# Patient Record
Sex: Female | Born: 1965
Health system: Southern US, Community
[De-identification: ages and names within clinical notes are randomized; demographics above are authoritative.]

## PROBLEM LIST (undated history)

## (undated) DIAGNOSIS — K5909 Other constipation: Secondary | ICD-10-CM

## (undated) DIAGNOSIS — R0989 Other specified symptoms and signs involving the circulatory and respiratory systems: Secondary | ICD-10-CM

## (undated) DIAGNOSIS — F419 Anxiety disorder, unspecified: Secondary | ICD-10-CM

## (undated) DIAGNOSIS — F99 Mental disorder, not otherwise specified: Secondary | ICD-10-CM

## (undated) DIAGNOSIS — E28319 Asymptomatic premature menopause: Secondary | ICD-10-CM

## (undated) HISTORY — DX: Other constipation: K59.09

## (undated) HISTORY — DX: Asymptomatic premature menopause: E28.319

## (undated) HISTORY — DX: Other specified symptoms and signs involving the circulatory and respiratory systems: R09.89

## (undated) HISTORY — DX: Anxiety disorder, unspecified: F41.9

## (undated) HISTORY — DX: Mental disorder, not otherwise specified: F99

---

## 2005-12-07 ENCOUNTER — Ambulatory Visit (HOSPITAL_COMMUNITY): Admission: RE | Admit: 2005-12-07 | Discharge: 2005-12-07 | Payer: Self-pay | Admitting: Obstetrics and Gynecology

## 2006-07-09 ENCOUNTER — Ambulatory Visit (HOSPITAL_COMMUNITY): Admission: RE | Admit: 2006-07-09 | Discharge: 2006-07-09 | Payer: Self-pay | Admitting: Family Medicine

## 2007-05-31 ENCOUNTER — Emergency Department (HOSPITAL_COMMUNITY): Admission: EM | Admit: 2007-05-31 | Discharge: 2007-05-31 | Payer: Self-pay | Admitting: *Deleted

## 2007-06-23 DIAGNOSIS — R0989 Other specified symptoms and signs involving the circulatory and respiratory systems: Secondary | ICD-10-CM

## 2007-06-23 DIAGNOSIS — R09A2 Foreign body sensation, throat: Secondary | ICD-10-CM

## 2007-06-23 DIAGNOSIS — R198 Other specified symptoms and signs involving the digestive system and abdomen: Secondary | ICD-10-CM

## 2007-06-23 HISTORY — DX: Other specified symptoms and signs involving the digestive system and abdomen: R19.8

## 2007-06-23 HISTORY — DX: Foreign body sensation, throat: R09.A2

## 2007-06-23 HISTORY — DX: Other specified symptoms and signs involving the circulatory and respiratory systems: R09.89

## 2007-07-29 ENCOUNTER — Ambulatory Visit: Payer: Self-pay | Admitting: Gastroenterology

## 2007-10-20 ENCOUNTER — Ambulatory Visit: Payer: Self-pay | Admitting: Internal Medicine

## 2007-10-21 HISTORY — PX: ESOPHAGOGASTRODUODENOSCOPY: SHX1529

## 2007-10-25 ENCOUNTER — Encounter: Payer: Self-pay | Admitting: Gastroenterology

## 2007-10-25 ENCOUNTER — Ambulatory Visit: Payer: Self-pay | Admitting: Gastroenterology

## 2007-10-25 ENCOUNTER — Ambulatory Visit (HOSPITAL_COMMUNITY): Admission: RE | Admit: 2007-10-25 | Discharge: 2007-10-25 | Payer: Self-pay | Admitting: Gastroenterology

## 2008-12-10 ENCOUNTER — Ambulatory Visit (HOSPITAL_COMMUNITY): Admission: RE | Admit: 2008-12-10 | Discharge: 2008-12-10 | Payer: Self-pay | Admitting: General Surgery

## 2010-09-08 ENCOUNTER — Ambulatory Visit (INDEPENDENT_AMBULATORY_CARE_PROVIDER_SITE_OTHER): Payer: PRIVATE HEALTH INSURANCE | Admitting: Urgent Care

## 2010-09-08 ENCOUNTER — Encounter: Payer: Self-pay | Admitting: Urgent Care

## 2010-09-08 DIAGNOSIS — K5909 Other constipation: Secondary | ICD-10-CM

## 2010-09-16 ENCOUNTER — Encounter: Payer: Self-pay | Admitting: Gastroenterology

## 2010-09-18 NOTE — Assessment & Plan Note (Signed)
Summary: ABD PAIN WITH CONSTIPATION   Vital Signs:  Patient profile:   45 year old female Height:      63 inches Weight:      177 pounds BMI:     31.47 Temp:     98.5 degrees F oral Pulse rate:   72 / minute BP sitting:   122 / 88  (left arm)  Vitals Entered By: Carolan Clines LPN (September 08, 2010 2:28 PM)  Visit Type:  Follow-up Visit Primary Care Provider:  Dr. Malvin Johns  Chief Complaint:  constipation.  History of Present Illness: 45 y/o hispanic female here w/ c/o constipation.  Has had constipation w/ BM q3 days, tried metamucil, seems to help w/ BM two times a day.  Has an MD in [place], had colonoscopy & believes she has "swollen intestines".  Colonoscopy normal.  Biopsies were taken, ? results.  Pain resolved w/ defecation.  Was given Debridat AP Trimebutina seemed to help some.  Miralax no help.  c/o abd bloating x several mo.  Lives in [place], but was visiting [place].  Denies vomiting, heartburn, indigestion or nausea.  Wt stable.    LMP 3 yrs ago, was seen by GYN in [place], told "early menopause"  Pacific Interpreter # [phone]  Current Medications (verified): 1)  Metamucil 30.9 % Powd (Psyllium) .... Take Two Tbsp Bid  Allergies (verified): No Known Drug Allergies  Past History:  Past Medical History: chronic constipation globus early menopause  normal EGDs Dr Darrick Penna 5/09  Past Surgical History: Unremarkable  Family History: No known family history of colorectal carcinoma, IBD, liver or chronic GI problems.  Social History: married 7 yrs 2 healthy Equity Patient has never smoked.  Alcohol Use - no Illicit Drug Use - no Smoking Status:  never Drug Use:  no  Review of Systems General:  Denies fever, chills, sweats, anorexia, fatigue, weakness, malaise, weight loss, and sleep disorder. ENT:  Denies earache, ear discharge, tinnitus, decreased hearing, nasal congestion, loss of smell, nosebleeds, sore throat, hoarseness, and difficulty swallowing; feels  like something in tonsils, seen by Dr Pollyann Kennedy. CV:  Denies chest pains, angina, palpitations, syncope, dyspnea on exertion, orthopnea, PND, peripheral edema, and claudication. Resp:  Denies dyspnea at rest, dyspnea with exercise, cough, sputum, wheezing, coughing up blood, and pleurisy. GI:  Denies difficulty swallowing, pain on swallowing, jaundice, and fecal incontinence. GU:  Denies urinary burning, blood in urine, nocturnal urination, urinary frequency, urinary incontinence, abnormal vaginal bleeding, and amenorrhea; early menopause. MS:  Denies joint pain / LOM, joint swelling, joint stiffness, joint deformity, low back pain, muscle weakness, muscle cramps, muscle atrophy, leg pain at night, leg pain with exertion, and shoulder pain / LOM hand / wrist pain (CTS). Derm:  Denies rash, itching, dry skin, hives, moles, warts, and unhealing ulcers. Psych:  Denies depression, anxiety, memory loss, suicidal ideation, hallucinations, paranoia, phobia, and confusion. Heme:  Denies bruising, bleeding, and enlarged lymph nodes.  Physical Exam  General:  Well developed, well nourished, no acute distress. Head:  Normocephalic and atraumatic. Eyes:  Sclera clear, no icterus. Ears:  Normal auditory acuity. Nose:  No deformity, discharge,  or lesions. Mouth:  No deformity or lesions, dentition normal. Neck:  Supple; no masses or thyromegaly. Lungs:  Clear throughout to auscultation. Heart:  Regular rate and rhythm; no murmurs, rubs,  or bruits. Abdomen:  Soft, mild tenderness LLQ on palpation and nondistended. No masses, hepatosplenomegaly or hernias noted. Normal bowel sounds.without guarding and without rebound.   Msk:  Symmetrical with  no gross deformities. Normal posture. Pulses:  Normal pulses noted. Extremities:  No clubbing, cyanosis, edema or deformities noted. Neurologic:  Alert and  oriented x4;  grossly normal neurologically. Skin:  Intact without significant lesions or rashes. Cervical  Nodes:  No significant cervical adenopathy. Psych:  Alert and cooperative. Normal mood and affect.   Impression & Recommendations:  Problem # 1:  CONSTIPATION, CHRONIC (ICD-564.09) 45 y/o  Hispanic female with chronic constipation. she is failed MiraLax and fiber. Will give a trial of him and he's 24 micrograms 1-2 daily when necessary. (2 boxes of samples given). she had a normal colonoscopy in on them all however I do not have biopsy reports. Orders: Est. Patient Level III (04540)  Patient Instructions: 1)   constipation literature given in Spanish 2)   office visit in 6-8 weeks with Dr. Darrick Penna 3)   She is warned about taking this medicine during pregnancy. However she tells me that since she has already gone through early menopause this is not an issue. Prescriptions: AMITIZA 24 MCG CAPS (LUBIPROSTONE) 1-2 by mouth daily as needed constipation w/ food  #62 x 2   Entered and Authorized by:   Joselyn Arrow FNP-BC   Signed by:   Joselyn Arrow FNP-BC on 09/08/2010   Method used:   Electronically to        [facility] (retail)       [address]       [place], [place]  [address]       Ph: [phone]       Fax: [phone]   RxID:   [id number]    Orders Added: 1)  Est. Patient Level III [72536]  Appended Document: ABD PAIN WITH CONSTIPATION F/U OPV W/ DR FIELDS IS IN THE COMPUTER

## 2010-10-16 ENCOUNTER — Encounter: Payer: Self-pay | Admitting: Gastroenterology

## 2010-10-16 ENCOUNTER — Ambulatory Visit (INDEPENDENT_AMBULATORY_CARE_PROVIDER_SITE_OTHER): Payer: PRIVATE HEALTH INSURANCE | Admitting: Gastroenterology

## 2010-10-16 VITALS — BP 130/76 | HR 76 | Temp 97.7°F | Ht 65.0 in | Wt 173.4 lb

## 2010-10-16 DIAGNOSIS — R109 Unspecified abdominal pain: Secondary | ICD-10-CM

## 2010-10-16 DIAGNOSIS — K5909 Other constipation: Secondary | ICD-10-CM

## 2010-10-16 MED ORDER — LUBIPROSTONE 8 MCG PO CAPS: ORAL_CAPSULE | ORAL | Status: DC

## 2010-10-16 NOTE — Patient Instructions (Signed)
Stop Metamucil. Add Amitiza ONE PILL AT NIGHT FOR 7 DAYS THEN ONCE DAILY. FOLLOW UP IN ONE MONTH. STOP DAIRY PRODUCTS. SEE HO. READ INFORMATION ABOUT Sndrome de colon irritable.  Dieta libre de Advice worker (Diet - Lactose-Free) La lactosa es un carbohidrato que se encuentra principalmente en la [place] y los productos lcteos, como tambin en alimentos con [place] y suero agregados. Para que la lactosa pueda ser [place] por el cuerpo, debe ser digerida por una enzima. La intolerancia a la lactosa ocurre cuando hay una escasez de [place]. Cuando su cuerpo no puede digerir la lactosa, puede sentir nuseas, hinchazn, calambres, gases y [place]. TIPOS DE DEFICIENCIA DE LACTASA  Deficiencia de lactasa primaria. ste es el tipo ms comn. Se caracteriza por una reduccin lenta de la actividad de la lactasa.   Deficiencia de Altamease Oiler. Esto ocurre luego de una lesin en la mucosa del instestino delgado como resultado de enfermedades como la enfermedad celaca, esprue no tropical, gastroenteritis infecciosa (virus estomacal), desnutricin, parsitos, o enfermedad inflamatoria intestinal. Tambin puede ocurrir luego de Education officer, environmental un tratamiento con medicamentos que matan grmenes (antibiticos), o drogas para Management consultant, o como resultado de [place].  La tolerancia a la lactosa vara ampliamente, y cada persona debe determinar cunta cantidad de [place] puede consumir para no desarrollar sntomas. Beber pequeas porciones de Merck & Co puede ser de [place]. Algunos estudios sugieren que retardar el vaciamiento gstrico puede ayudar a aumentar la tolerancia a productos lcteos. Esto puede realizarse mediante:  El consumo de [place] o productos lcteos acompaado de otros alimentos, Teacher, English as a foreign language de consumirlos solos.   Consumir leche con un mayor contenido graso.  Existen muchos productos lcteos que International aid/development worker pueden tolerar mejor que la leche:  El queso (especialmente queso aejo) - el  contenido de lactosa es mucho menor que en la [place].   El consumo de productos lcteos cultivados, como yogur, suero de [place], requesn, y [place] de [address] (kfir) normalmente es bien tolerado por individuos con deficiencia de Building control surveyor. Esto ocurre porque las bacterias ayudan a Therapist, nutritional.   La leche con lactosa hidrolizada contiene un 40-90% menos de lactosa que la [place] y tambin puede ser [place].  REQUERIMIENTOS Estas dietas pueden ser deficientes en calcio, riboflavina, y vitamina D, segn los Recommended Dietary Allowances (cantidades recomendadas en la dieta) del Exxon Mobil Corporation ([employer] de [place]). Es posible que se puedan PepsiCo recomendados, esto depende de la [place] individual y el consumo de sustitutos de [place], [place], u otros productos lcteos. NOTAS ESPECIALES  La lactosa es un carbohidrato. La principal fuente de alimento son los productos lcteos. Es Secondary school teacher los rtulos de los alimentos. Muchos productos contienen lactosa aunque no hayan sido hechos a partir de Freescale Semiconductor. Busque las siguientes palabras: Suero, slidos lcteos, slidos lcteos deshidratados, polvo de [place] sin grasa. Entre las fuentes comunes de lactosa adems de los productos lcteos se incluyen panes, caramelos, embutidos, alimentos preparados y procesados, y salsas y Financial controller.   Todos los alimentos deben prepararse sin [place], crema u otros productos lcteos.   Puede ser necesario un suplemento de vitaminas/minerales. Consulte con su mdico o nutricionista registrado.   La lactosa tambin se encuentra en muchos medicamentos de prescripcin o de venta libre.   Puede consumir leche de soja y suplementos libres de lactosa como alternativa a la [place].  GRUPO DE ALIMENTOS PERMITIDOS/RECOMENDADOS EVITE / USE MODERADAMENTE  PANES/FECULAS 4 porciones o ms*  Panes y bollos hechos sin leche. Pan francs,  de [place], [place]. Panes y bollos  hechos que ConocoPhillips. Mezclas preparadas como pasteles, bizcochos, buuelos, panqueques. [place]quillas dulces, donas, tostada francesa (si contiene [place]).  Galletas:  Galletas de soda, galletas graham. Cualquier galleta preparada sin lactosa. Bizcochos tostados, o cualquiera que Centex Corporation.  Cereales: Cereales cocidos o deshidratados preparados sin lactosa (vea el rtulo).  Cereales cocidos o deshidratados preparados con lactosa (vea el rtulo). Total, Cocoa Krispies, Special K.  Patatas / pastas / arroz: Cualquiera, preparados sin [place] o [place]. Palomitas de maz. Pur de papas instantneo, papas fritas congeladas, papas festoneadas o gratinadas.  VEGETALES 2 porciones o ms Vegetales frescos, congelados o enlatados.  Vegetales con crema o rebozados. Vegetales en salsa de queso o con margarinas que contengan lactosa.  FRUTAS 2 porciones o ms Frutas frescas, enlatadas o congeladas que no estn procesadas con lactosa. Frutas enlatadas o congeladas que hayan sido procesadas con lactosa.  CARNES Y SUSTITUTOS 2 porciones o ms 100 A 150 g por da Bife, pollo, pescado, pavo, cordero, ternero, cerdo o [place]. Productos preparados con carne. Alimentos crnicos preparados para bebs que no contengan leche. Huevos, soja, frutos secos. Huevos revueltos, omelettes y souffles que ConocoPhillips. Scrambled eggs, omelets, and souffles that contain milk. Carne, pescado o aves de corral con crema o empanadas. Salchichas de viena, leverwurst o fiambres que contengan slidos de [place]. Queso, queso cottage o queso untable.  LECHE The Mutual of Omaha. (Vase "BEBIDAS" para los sustitutos de Freescale Semiconductor . Vase "POSTRES" para helados y postres helados.) Leche (entera, al 2%, descremada o chocolatada). Evaporada, en polvo o condensada; Monsanto Company.  SOPAS Y ALIMENTOS COMBINADOS  Sopa, caldo, sopa de verduras, consoms. Sopas preparadas en casa con los alimentos permitidos. Alimentos combinados o preparados  que no contengan leche ni productos lcteos (lea las etiquetas). Sopas crema, en latas. Sopas comerciales que contengan lactosa Macaroni con queso, pizza. Alimentos combinados o preparados que contengan leche o productos lcteos.  POSTRES Y DULCES  (con moderacin) Helados de agua y de fruta; gelatina; torta ngel. Galletitas, tortas, pasteles caseros preparados con los ingredientes permitidos. Budn (preparado con agua o sustituto de [place]). Postres de tofu sin lactosa. Azcar, miel, jarabe de maz, mermelada, gelatina, dulces, melaza (azcar de caa); caramelos de azcar; marshmallows. Helados de crema, sorbetes, flan, budn, yougur helado. Mezclas comerciales para preparar tortas y galletitas. Postres que contengan chocolate. Masa para pastel que Clinical research associate; postres reducidos en caloras preparados con sustitutos del azcar que Teacher, adult education. Caramelos toffee, de menta, caramelos duros, chocolate.  GRASAS Y ACEITES (con moderacin) [place], (segn la tolerancia; contiene muy pequea cantidad de lactosa). Margarinas y Amgen Inc no contengan [place], aceites [place], [place], Miracle Kildeer, [place], crema artificial y coberturas sin Advice worker ni slidos de [place] agregados (ejemplos: Coffee Rich, Carnation Bucks, Rich's Whipped Topping, Social worker). [place]. Margarinas y aderezos para [address] que contengan [place]; [place], [place] de man con slidos de [place] agregados, crema agria, bocaditos preparados con crema agria.  BEBIDAS Bebidas carbonatadas, t, caf, y caf soluble Carbonated drinks; tea; caf y caf soluble; algunos cafs instantneos (verifique las etiquetas) Bebidas frutales; jugos de frutas y de vegetales; [place] de arroz o de soja.  Ovaltine, chocolate caliente. Algunos cacaos, algunos cafs instantneos; ts instantneos; jugos en polvo (lea las etiquetas).  CONDIMENToS / MISCELNEA  Salsa de soja, polvo de algarroba, aceitunas, salsa preparada con agua, cacao,  condimentos y especias, glutamato monosdico, catsup, Clinical research associate. Algunas gomas de mascar, chocolate, algunos cacaos. Ciertos antibiticos y preparados de vitaminas y minerales. Condimentos  que contengan productos lcteos. Endulzantes artificieles que contengan lactosa, como Equal (Nutra-Sweet) y Sweet 'n Low. Algunas cremas no lcteas (lea las etiquetas).  *Estas cantidades indican el nmero mnimo de porciones que se necesitan de los grupos bsicos de alimentos para proporcionar una variedad de nutrientes que son fundamentales para Pharmacologist una buena salud. La cantidad mxima se indica cuando debe controlarse la cantidad de ciertos alimentos. La combinacin de alimentos puede contarse como porciones completas o parciales de los grupos de alimentos. Se recomienda consumir vegetales de hoja de color verde oscuro o vegetales de color anaranjado 3  4 veces por semana, para incorporar vitamina A. Se recomienda el consumo diario de alimentos que sean fuente de vitamina C. Las patatas pueden incluirse como porcin de vegetales. EJEMPLO DE MEN*  Desayuno   Jugo de naranjas   [place]tano   Bran flakes   Desnatador no lcteo  Pan de [place]ien[place] (brind)   [place] o Psychiatrist y Mining engineer que no Therapist, sports   T o caf    Almuerzo   Doctor, hospital de pollo  Arroz   Habichuelas   [place] o Psychiatrist y aderezos que no contengan leche   Meln fresco   T o caf    Cena   Ase carne de vaca  Papa asada   [place] o margarinas y aderezos que no contengan leche   Brcol  Ensalada de Company secretary con el vinagre y el petrleo que visten   Bizcocho de alimento de [place]   T [place]   Document Released: 06/08/2005 Document Re-Released: 12/05/2007 ExitCare Patient Information 2011 [place], [place]. ONLY DECAFFEINATED COFFEE.  Sndrome de colon irritable (Colon espstico) (Irritable Bowel Syndrome, Spastic Colon) El sndrome de colon irritable se origina en un trastorno de la funcin normal del intestino  Tambin se lo denomina colon espstico, colitis mucosa y colon irritable. No es necesario realizar un tratamiento quirrgico, ni es un problema que pueda transformarse en cncer. No hay cura para este sndrome. Pero con Neomia Dear dieta apropiada, reduccin del estrs y Tourist information centre manager, usted notar que sus problemas (sntomas) gradualmente desaparecern o mejorarn. Se trata de una enfermedad frecuente del aparato digestivo. Aparece con frecuencia a finales de la adolescencia o comienzos de la vida adulta., La proporcin de mujeres que sufren este trastorno es del doble que los hombres. CAUSAS Luego que el alimento es digerido y absorbido en el intestino delgado, Animator de desecho se dirige hacia el colon (intestino grueso). En el colon se absorben el agua y las sales que provienen de los alimentos no digeridos que se encuentran en el intestino delgado. Los residuos que [place], o materia fecal, se retiene para su posterior eliminacin. Bajo circunstancias normales, ciertas contracciones suaves y rtmicas de las paredes del intestino empujan la materia fecal a lo largo del colon, hacia el recto. Sin embargo, cuando se presenta este problema, estas contracciones son irregulares y no coordinadas. Entonces ocurre que la materia fecal se retiene por un perodo prolongado, lo que origina constipacin, o se expele demasiado rpido, lo que produce diarrea. SNTOMAS El sntoma ms frecuente es Chief Technology Officer. Lo ms frecuente es que el dolor se localice en la zona izquierda inferior del vientre (abdomen). Pero puede ocurrir en cualquier rea del abdomen. Puede sentirse como acidez de [place], Engineer, mining de espalda o Teacher, adult education un dolor sordo en los brazos o en los hombros. El dolor se origina en espasmos excesivos de los msculos del intestino y de la acumulacin de gases y materia fecal en el colon. Este dolor:  Puede oscilar entre clicos agudos en el vientre (abdomen) hasta un dolor sordo y continuo.   Generalmente empeora luego de  comer.   Es caracterstico que se alivie al mover el intestino o Halliburton Company gases.  Generalmente se acompaa de constipacin. Pero tambin puede PACCAR Inc. La diarrea se produce inmediatamente despus de comer o al levantarse por la maana. Las heces son blandas y International aid/development worker. Con frecuencia estn mezcladas con secreciones (mucus). Otros sntomas son:  Environmental manager   ONEOK   Prdida del apetito  Ganas de vomitar (nuseas)   Vmitos  Esta enfermedad tambin puede causar otros sntomas que no se relacionan con el sistema digestivo.  Fatiga.   Estados de ansiedad   Dificultades de Librarian, academic.  Dolor de [place].   Falta de [employer] Depot.   Estos sntomas tienden a Research officer, trade union y Geneticist, molecular. DIAGNSTICO Los sntomas se asemejan a los de otras enfermedades ms graves del aparato digestivo. Por lo tanto el profesional que lo asiste le indicar una serie adicional de anlisis para descartarlas. Debe estar seguro de hallar la causa (diagnostico) En este caso, se le explicar la naturaleza y el propsito de cada prueba. TRATAMIENTO Existe un buen nmero de medicamentos disponibles que ayudan a corregir el funcionamiento intestinal y/o a Occupational hygienist intestinales y Chief Technology Officer abdominal. Los medicamentos disponibles son:  Jodi Marble, no irritantes para los casos de constipacin grave y para Contractor a Dietitian.   Medicamentos antidiarreicos especficos para tratar los [place] graves o prolongados de [place].   Antiespasmdicos para Copywriter, advertising.   El profesional que lo asiste tambin podr indicarle un tranquilizante o sedante suave durante los perodos extraordinarios de estrs en su vida.  Lo ms importante es recordar que si le prescriben un medicamento, deber tomarlo exactamente como se lo han indicado. Hgale saber al profesional que lo asiste si ha obtenido alivio al tomarlo. INSTRUCCIONES PARA  EL CUIDADO DOMICILIARIO  Evite los alimentos ricos en grasas o aceites. Por ejemplo, crema, manteca, salchichas, embutidos y otras comidas grasas.   Evite los alimentos que puedan tener efectos laxantes, como frutas, jugos de fruta y productos lcteos.   Elimine las bebidas gaseosas, la goma de [place] y los alimentos que producen gases como frijoles y col. Esto ayuda a Technical sales engineer hinchazn y los eructos.   Consuma salvado con gran cantidad de lquidos puede ayudar a combatir la constipacin.   Observe cules son los alimentos que originan sus sntomas.   Evite las situaciones de gran carga emocional o las circunstancias que producen ansiedad.   Comience o contine con un plan de ejercicios.   Descanse y duerma lo suficiente.  EST SEGURO QUE:   Comprende las instrucciones para el alta mdica.   Controlar su enfermedad.   Solicitar atencin mdica de inmediato segn las indicaciones.  Document Released: 06/08/2005 Document Re-Released: 10/25/2008 [facility] Patient Information 2011 [place], [place].

## 2010-10-20 ENCOUNTER — Encounter: Payer: PRIVATE HEALTH INSURANCE | Admitting: Gastroenterology

## 2010-10-23 ENCOUNTER — Encounter: Payer: Self-pay | Admitting: Gastroenterology

## 2010-10-23 DIAGNOSIS — R109 Unspecified abdominal pain: Secondary | ICD-10-CM | POA: Insufficient documentation

## 2010-10-23 NOTE — Progress Notes (Signed)
Pt is aware of her one month follow up on 5/30 at 330 pm with [facility]

## 2010-10-23 NOTE — Progress Notes (Signed)
No current PCP.

## 2010-10-23 NOTE — Progress Notes (Signed)
  Subjective:    Patient ID: Zoe Garcia, female    DOB: Jun 03, 1966, 45 y.o.   MRN: [id number]  HPI PT IS SPANISH SPEAKING ONLY. History obtained via husband and interpreter [phone] ([phone]). Pt continues to complain of abd pain and bloating after she eats. The discomfort starts at the top and moves to the bottom. Last EGD 2009 for globus sensation-no Bx of stomach taken. Her husband is concerned because she thinks her abd problems are 2o to ? Oral sex and and they have not had sex in 2 years. She reportedly had a normal TCS in [place]. Pt upset because she was unable to get her Amitiza.  Past Medical History  Diagnosis Date  . Chronic constipation   . Early menopause   . Globus sensation 2009    NL BPE 2008, 2010   Past Surgical History  Procedure Date  . Esophagogastroduodenoscopy 05/09    NL ESO BX    Review of Systems  All other systems reviewed and are negative.   2009 176 lbs    Objective:   Physical Exam  Constitutional: She is oriented to person, place, and time. She appears well-developed and well-nourished. No distress.  HENT:  Head: Normocephalic and atraumatic.  Eyes: Pupils are equal, round, and reactive to light.  Neck: Normal range of motion. Neck supple.  Cardiovascular: Normal rate and regular rhythm.   Pulmonary/Chest: Effort normal.  Abdominal: Soft. Bowel sounds are normal. There is tenderness. There is no rebound and no guarding.       MILD LUQ  Musculoskeletal: She exhibits no edema.  Lymphadenopathy:    She has no cervical adenopathy.  Neurological: She is alert and oriented to person, place, and time.  Skin: Skin is warm and dry.          Assessment & Plan:

## 2010-10-23 NOTE — Assessment & Plan Note (Addendum)
Most likely 2o to IBS-c. The differential diagnosis includes H. pylori gastritis and less likely gastric CA.Weight stable since 2009.  EGD/GASTRIC Bx MON MAY 7. CT SCAN OF THE A/P IF NO SOURCE FOR ABD pain IDENTIFIED.

## 2010-10-23 NOTE — Assessment & Plan Note (Addendum)
Most likely 2o to IBS-c.  Stop Metamucil. Do not drink coffee that has caffeine in it. Decaf ok. Add Amitiza ONE PILL AT NIGHT FOR 7 DAYS THEN ONCE DAILY. FOLLOW UP IN ONE MONTH. STOP DAIRY PRODUCTS. SEE HO. READ INFORMATION ABOUT Sndrome de colon irritable.  TIME SPENT: 45 MINS to obtain H&p, discuss plan.

## 2010-10-27 ENCOUNTER — Ambulatory Visit (HOSPITAL_COMMUNITY)
Admission: RE | Admit: 2010-10-27 | Payer: PRIVATE HEALTH INSURANCE | Source: Ambulatory Visit | Admitting: Gastroenterology

## 2010-10-27 ENCOUNTER — Encounter: Payer: PRIVATE HEALTH INSURANCE | Admitting: Gastroenterology

## 2010-11-04 NOTE — Consult Note (Signed)
NAMEGUILA, OWENSBY            ACCOUNT NO.:  [id number]   MEDICAL RECORD NO.:  [id number]          PATIENT TYPE:  EMS   LOCATION:  ED                            FACILITY:  [facility]   PHYSICIAN:  Kassie Mends, M.D.      DATE OF BIRTH:  1965/07/14   DATE OF CONSULTATION:  07/29/2007  DATE OF DISCHARGE:  05/31/2007                                 CONSULTATION   REFERRING PHYSICIAN:  Barbaraann Barthel, M.D.   REASON FOR CONSULTATION:  Pain with swallowing.   HISTORY OF PRESENT ILLNESS:  Ms. Zoe Garcia is a 45 year old female who  has had complaints of pain with swallowing for the last 2 weeks.  She  vomited and saw something like a little ball come out.  She also  complains of bad breath.  Her throat only hurts when she swallows.  She  was tried on penicillin, which did not make any difference in her  symptoms.  She did not have any vomiting, indigestion, diarrhea or  constipation or weight loss.  She has not had a menstrual cycle in a  year.  She sometimes has to strain with bowel movements, and MiraLax  once a day did not help.  The information is obtained from her friend,  who has served as an Equities trader for the day.   PAST MEDICAL HISTORY:  None.   PAST SURGICAL HISTORY:  None.   ALLERGIES:  None.   MEDICATIONS:  Nexium 40 mg daily.   FAMILY HISTORY:  She denies any family history of colon cancer or colon  polyps.   SOCIAL HISTORY:  She is married and works at [employer]  She denies  any tobacco or alcohol use.   REVIEW OF SYSTEMS:  As per the HPI, otherwise all systems negative.   PHYSICAL EXAM:  VITAL SIGNS:  Weight 176 pounds, temperature 98.2, blood  pressure 120/72, pulse 60.  GENERAL:  She is in no apparent distress, alert and orient x4.  HEENT:  Exam is atraumatic, normocephalic.  Pupils equal and reactive to light.  Posterior pharynx without erythema, edema, deformities, or exudate.  Her  tonsils are small.  NECK:  Has full range of motion and no  lymphadenopathy.  Palpation of  the neck is nontender.  She was unable to appreciate any thyromegaly.  She has no lymphadenopathy.  LUNGS:  Clear to auscultation bilaterally.  CARDIOVASCULAR:  A regular rhythm, no murmur.  ABDOMEN:  Bowel sounds  are present, soft, nontender, nondistended.  No rebound or guarding.  EXTREMITIES:  Have no cyanosis or edema.  NEURO:  She has no focal  neurologic deficits.   RADIOGRAPHIC STUDIES:  January 2008, she had a barium swallow which  showed normal esophageal distention and motility.  She had no stricture  or mass and had an unobstructed passage of barium from the oral cavity  into her stomach.  Her views of the hypopharynx and cervical esophagus  were normal.   ASSESSMENT:  Ms. Hainer is a 44 year old female who has constipation  and odynophagia.  The odynophagia may be secondary to herpes simplex  esophagitis, pill esophagitis, or candida esophagitis.  She has a low  likelihood of having gastroesophageal reflux disease as an etiology for  odynophagia.   Thank you for allowing me to see Ms. Vine in consultation.  My  recommendations follow.   RECOMMENDATIONS:  1. She has increasing MiraLax to twice a day.  She is given a      prescription and a coupon.  2. She will have an upper endoscopy next week to evaluate odynophagia.  3. She has a follow-up appointment to see me in 2 months.      Kassie Mends, M.D.  Electronically Signed     SM/MEDQ  D:  07/29/2007  T:  07/30/2007  Job:  [id number]   cc:   Barbaraann Barthel, M.D.  Fax: [phone]

## 2010-11-04 NOTE — Op Note (Signed)
NAMEKENZLEIGH, Zoe Garcia              ACCOUNT NO.:  [id number]   MEDICAL RECORD NO.:  [id number]          PATIENT TYPE:  AMB   LOCATION:  DAY                           FACILITY:  [facility]   PHYSICIAN:  Kassie Mends, M.D.      DATE OF BIRTH:  [birth year]   DATE OF PROCEDURE:  10/25/2007  DATE OF DISCHARGE:                               OPERATIVE REPORT   REFERRING PHYSICIAN:  Barbaraann Barthel, MD.   PROCEDURE:  Esophagogastroduodenoscopy with cold forceps biopsy.   INDICATIONS FOR EXAM:  Ms. Oldenburg is a 45 year old female who  presents with a feeling like something stuck in her throat.  She was  seen and evaluated in February and scheduled for an endoscopy, which she  cancelled.  Additionally, she complained of pain with swallowing.  She  has also complained of bad breath.  She was placed on Nexium for 2  months and her symptoms did not improve.  She said she was seen and  evaluated by dentists, but they could not find a cause for her  halitosis.  She reports feeling like there is something in her throat,  and at times, she is able to cough little balls of white stuff out.  She  denies any difficulty swallowing food.  The history is somewhat limited  because the patient is Spanish-speaking only. History obtained via a  [place] interpreter.   FINDINGS:  1. Normal esophagus without evidence of Barrett's,  mass, erosion,      ulceration, or stricture.  Biopsies obtained 32 cm from the teeth      and 22 cm from the teeth to evaluate for eosinophilic esophagitis.  2. Normal stomach, duodenal bulb, and second portion of the duodenum.   DIAGNOSIS:  No source for Ms. Hunzeker's symptoms identified on upper  endoscopy.  No obvious mass identified in the posterior pharynx.   RECOMMENDATIONS:  1. For her constipation, she is asked to drink 6 to 8 cups of water      daily.  She may continue the MiraLax twice daily.  She may add      Dulcolax 10 mg daily if she is unable to achieve a bowel  movement      every 2 to 3 days.  2. She should follow a high-fiber diet.  She is given a handout on      high-fiber diet.  3. No aspirin and NSAIDs for 7 days.  No anticoagulation for 7 days.  4. We will schedule her an appointment with [facility] Thro[facility] to      have a complete evaluation of the posterior pharynx.  It sounds      like she is having food impacted on her tonsils, which is causing      her to feel like she has bad breath and pain with swallowing as      well as food stuck in at the back of her throat.  5. She has a follow up appointment to see me in 4 weeks to reevaluate      her constipation.  She needs an E30 visit.  MEDICATIONS:  1. Demerol 75 mg IV.  2. Versed 6 mg IV.   PROCEDURE TECHNIQUE:  Physical exam was performed.  Informed consent was  obtained from the patient after we explained the benefits, risks, and  alternatives to the procedure.  The patient was connected to monitor and  placed in left lateral position.  Continuous oxygen was provided by  nasal cannula.  IV medicine administered through an indwelling cannula.  After administration of sedation, the patient's esophagus was intubated  and the scope was advanced under direct visualization to the second  portion of duodenum.  The scope was removed slowly by carefully  examining the color, texture, anatomy, and integrity of the mucosa on  the way out.  The patient was recovered in endoscopy and discharged home  in satisfactory condition.   PATH:  Biopsies Nl-no evidence for eosinophilic or reflux esophagitis.      Kassie Mends, M.D.  Electronically Signed     SM/MEDQ  D:  10/25/2007  T:  10/26/2007  Job:  [id number]   cc:   Barbaraann Barthel, M.D.  Fax: [phone]

## 2010-11-04 NOTE — H&P (Signed)
NAME:  Zoe Garcia, WEBER              ACCOUNT NO.:  [id number]   MEDICAL RECORD NO.:  [id number]          PATIENT TYPE:  END   LOCATION:  DAY                           FACILITY:  [facility]   PHYSICIAN:  R. Roetta Sessions, M.D. DATE OF BIRTH:  06/29/1965   DATE OF ADMISSION:  DATE OF DISCHARGE:  LH                              HISTORY & PHYSICAL   REASON FOR VISIT:  Pain with swallowing, feels like something stuck in  my throat.   PHYSICIAN COSIGNING NOTE:  Dr. Jena Gauss  in Dr. Ulyses Southward absence.   HISTORY OF PRESENT ILLNESS:  Zoe Garcia is a 45 year old Hispanic  female whom we saw back in February 2009, for the same symptoms of pain  with swallowing, feeling like something is stuck in her throat,  halitosis.  We scheduled her for an EGD; however, she cancelled it.  She  presents back now with the same symptoms.  She had been on Nexium for  about 2 months with no noted improvement.  She initially also took  antibiotic therapy for 5 days with no improvement.  She recently was  sent to a dentist and they could not find any cause for her halitosis.  She is taking MiraLax with good results for constipation.  She denies  any blood in her stool and denies any melena.  She continues to feel  like there is something in her throat and at times is able to cough a  little ball of white stuff out.  She denies any dysphagia to solid  foods, but does have pain when she swallows.  Symptoms are less  pronounced when she is eating, however.  She denies any heartburn or  indigestion.   CURRENT MEDICATIONS:  MiraLax b.i.d.   ALLERGIES:  No known drug allergies.   PAST MEDICAL HISTORY:  Negative for chronic illnesses.   PAST SURGICAL HISTORY:  Negative for chronic illnesses.   FAMILY HISTORY:  Denies family history of colon cancer or colon polyps.   SOCIAL HISTORY:  She is married and works at [employer]  She denies  tobacco or alcohol use.   REVIEW OF SYSTEMS:  See HPI for GI.  CONSTITUTIONAL:  No  weight loss.  CARDIOPULMONARY:  No chest pain or shortness of breath.   PHYSICAL EXAMINATION:  VITAL SIGNS:  Weight 176, temperature 98.6, blood  pressure 120/86, and pulse 60.  GENERAL:  A pleasant, well-nourished and well-developed Hispanic female,  in no acute distress.  SKIN:  Warm and dry.  No jaundice.  HEENT:  Sclerae nonicteric.  Oropharyngeal mucosa is moist and pink.  No  lesions, erythema, or exudate.  NECK:  No lymphadenopathy or thyromegaly.  CHEST:  Lungs are clear to auscultation.  CARDIAC:  Reveals regular rate and rhythm.  No murmurs, rubs, or  gallops.  ABDOMEN:  Positive bowel sounds.  Abdomen is soft, nontender, and  nondistended.  No organomegaly or masses.  No rebound or guarding.  LOWER EXTREMITIES:  No edema.   IMPRESSION:  The patient is a 45 year old lady who presents with  odynophagia and a feeling of something stuck in her throat.  She has  been treated for gastroesophageal reflux disease with Nexium 40 mg daily  without any improvement of these symptoms.  She also complains of  halitosis.  Esophagogastroduodenoscopy was cancelled previously.   PLAN:  1. Reschedule EGD with Dr. Cira Servant.  2. Continue MiraLax as before b.i.d. p.r.n.  3. A trial of Aciphex 20 mg p.o. b.i.d., #30 samples.      Zoe Garcia, P.AJonathon Bellows, M.D.  Electronically Signed    LL/MEDQ  D:  10/20/2007  T:  10/21/2007  Job:  [id number]   cc:   Barbaraann Barthel, M.D.  Fax: [phone]

## 2010-11-19 ENCOUNTER — Ambulatory Visit: Payer: PRIVATE HEALTH INSURANCE | Admitting: Gastroenterology

## 2014-11-24 ENCOUNTER — Encounter (HOSPITAL_COMMUNITY): Payer: Self-pay | Admitting: *Deleted

## 2014-11-24 ENCOUNTER — Emergency Department (HOSPITAL_COMMUNITY)
Admission: EM | Admit: 2014-11-24 | Discharge: 2014-11-25 | Disposition: A | Discharge status: Home / Self Care | Payer: BLUE CROSS/BLUE SHIELD | Attending: Emergency Medicine | Admitting: Emergency Medicine

## 2014-11-24 DIAGNOSIS — K5900 Constipation, unspecified: Secondary | ICD-10-CM | POA: Diagnosis present

## 2014-11-24 DIAGNOSIS — Z79899 Other long term (current) drug therapy: Secondary | ICD-10-CM | POA: Insufficient documentation

## 2014-11-24 DIAGNOSIS — Z8639 Personal history of other endocrine, nutritional and metabolic disease: Secondary | ICD-10-CM | POA: Insufficient documentation

## 2014-11-24 DIAGNOSIS — Z8659 Personal history of other mental and behavioral disorders: Secondary | ICD-10-CM | POA: Insufficient documentation

## 2014-11-24 NOTE — ED Notes (Signed)
Pt reports no BM today.  Reports last BM was very hard.

## 2014-11-25 NOTE — ED Notes (Signed)
[facility] pt states she feels much better

## 2014-11-25 NOTE — ED Provider Notes (Signed)
CSN: [id number]     Arrival date & time 11/24/14  2309 History   First MD Initiated Contact with Patient 11/24/14 2319   This chart was scribed for Azalia BilisKevin Kito Cuffe, MD by Marica OtterNusrat Rahman, ED Scribe. This patient was seen in room APA19/APA19 and the patient's care was started at 12:40 AM.   Chief Complaint  Patient presents with  . Constipation   The history is provided by the patient. No language interpreter was used.   PCP: No primary care provider on file. HPI Comments: Zoe Garcia is a 49 y.o. female, with PMH noted below, who presents to the Emergency Department complaining of constipation onset today. Pt notes that she had no bowel movements today. Pt denies fever, nausea, vomiting, diarrhea, abd pain.   Past Medical History  Diagnosis Date  . Chronic constipation   . Early menopause   . Globus sensation 2009    NL BPE 2008, 2010   Past Surgical History  Procedure Laterality Date  . Esophagogastroduodenoscopy  05/09    NL ESO BX    History reviewed. No pertinent family history. History  Substance Use Topics  . Smoking status: Never Smoker   . Smokeless tobacco: Not on file  . Alcohol Use: No   OB History    No data available     Review of Systems A complete 10 system review of systems was obtained and all systems are negative except as noted in the HPI and PMH.     Allergies  Review of patient's allergies indicates no known allergies.  Home Medications   Prior to Admission medications   Medication Sig Start Date End Date Taking? Authorizing Provider  lubiprostone (AMITIZA) 8 MCG capsule 1 po qhs for 7 days then 1 po bid. TAKE WITH FOOD TO PREVENT NAUSEA. 10/16/10   West BaliSandi L Fields, MD  Psyllium (METAMUCIL) 30.9 % POWD Take by mouth 1 dose over 46 hours.      Historical Provider, MD   Triage Vitals: BP 144/72 mmHg  Pulse 58  Temp(Src) 98 F (36.7 C) (Oral)  Resp 16  SpO2 100% Physical Exam  Constitutional: She is oriented to person, place, and time. She  appears well-developed and well-nourished. No distress.  HENT:  Head: Normocephalic and atraumatic.  Eyes: EOM are normal.  Neck: Normal range of motion.  Cardiovascular: Normal rate, regular rhythm and normal heart sounds.   Pulmonary/Chest: Effort normal and breath sounds normal.  Abdominal: Soft. She exhibits no distension. There is no tenderness.  Genitourinary:  Chaperone was present. Patient with constipation. There are no external fissures noted. No induration of the skin or swelling. No external hemorrhoids seen. Patient able to tolerate examination. Impacted brown stool.     Musculoskeletal: Normal range of motion.  Neurological: She is alert and oriented to person, place, and time.  Skin: Skin is warm and dry.  Psychiatric: She has a normal mood and affect. Judgment normal.  Nursing note and vitals reviewed.   ED Course  Fecal disimpaction Date/Time: 11/25/2014 1:54 AM Performed by: Azalia BilisAMPOS, Telissa Palmisano Authorized by: Azalia BilisAMPOS, Bodhi Moradi Consent: Verbal consent obtained. Risks and benefits: risks, benefits and alternatives were discussed Consent given by: patient Patient tolerance: Patient tolerated the procedure well with no immediate complications Comments: Large brown stool removed   (including critical care time) DIAGNOSTIC STUDIES: Oxygen Saturation is 100% on RA, nl by my interpretation.    COORDINATION OF CARE: 12:47 AM-Discussed treatment plan which includes rectal exam, rectal disimpaction, enema with pt at bedside and  pt agreed to plan.   Labs Review Labs Reviewed - No data to display  Imaging Review No results found.   EKG Interpretation None      MDM   Final diagnoses:  Constipation, unspecified constipation type    Fecal disimpaction followed by soap suds enema. Improvement in symptoms. OTC meds recommended  I personally performed the services described in this documentation, which was scribed in my presence. The recorded information has been reviewed  and is accurate.      Azalia Bilis, MD 11/25/14 [phone]

## 2014-11-25 NOTE — ED Notes (Signed)
Pt given soap suds enema; pt administered half the bag and waiting for results

## 2014-11-25 NOTE — Discharge Instructions (Signed)
Estreimiento (Constipation) Estreimiento significa que una persona tiene menos de tres evacuaciones en una semana, dificultad para defecar, o que las heces son secas, duras, o ms grandes que lo normal. A medida que envejecemos el estreimiento es ms comn. Si intenta curar el estreimiento con medicamentos que producen la evacuacin de las heces (laxantes), el problema puede empeorar. El uso prolongado de laxantes puede hacer que los msculos del colon se debiliten. Una dieta baja en fibra, no tomar suficientes lquidos y el uso de ciertos medicamentos pueden empeorar el estreimiento.  CAUSAS   Ciertos medicamentos, como los antidepresivos, analgsicos, suplementos de hierro, anticidos y diurticos.  Algunas enfermedades, como la diabetes, el sndrome del colon irritable, enfermedad de la tiroides, o depresin.  No beber suficiente agua.  No consumir suficientes alimentos ricos en fibra.  Situaciones de estrs o viajes.  Falta de actividad fsica o de ejercicio.  Ignorar la necesidad sbita de defecar.  Uso en exceso de laxantes. SIGNOS Y SNTOMAS   Defecar menos de tres veces por semana.  Dificultad para defecar.  Tener las heces secas y duras, o ms grandes que las normales.  Sensacin de estar lleno o hinchado.  Dolor en la parte baja del abdomen.  No sentir alivio despus de defecar. DIAGNSTICO  El mdico le har una historia clnica y un examen fsico. Pueden hacerle exmenes adicionales para el estreimiento grave. Estos estudios pueden ser:  Un radiografa con enema de bario para examinar el recto, el colon y, en algunos casos, el intestino delgado.  Una sigmoidoscopia para examinar el colon inferior.  Una colonoscopia para examinar todo el colon. TRATAMIENTO  El tratamiento depender de la gravedad del estreimiento y de la causa. Algunos tratamientos nutricionales son beber ms lquidos y comer ms alimentos ricos en fibra. El cambio en el estilo de vida  incluye hacer ejercicios de manera regular. Si estas recomendaciones para realizar cambios en la dieta y en el estilo de vida no ayudan, el mdico le puede indicar el uso de laxantes de venta libre para ayudarlo a defecar. Los medicamentos recetados se pueden prescribir si los medicamentos de venta libre no lo ayudan.  INSTRUCCIONES PARA EL CUIDADO EN EL HOGAR   Consuma alimentos con alto contenido de fibra, como frutas, vegetales, cereales integrales y porotos.  Limite los alimentos procesados ricos en grasas y azcar, como las papas fritas, hamburguesas, galletas, dulces y refrescos.  Puede agregar un suplemento de fibra a su dieta si no obtiene lo suficiente de los alimentos.  Beba suficiente lquido para mantener la orina clara o de color amarillo plido.  Haga ejercicio regularmente o segn las indicaciones del mdico.  Vaya al bao cuando sienta la necesidad de ir. No se aguante las ganas.  Tome solo medicamentos de venta libre o recetados, segn las indicaciones del mdico. No tome otros medicamentos para el estreimiento sin consultarlo antes con su mdico. SOLICITE ATENCIN MDICA DE INMEDIATO SI:   Observa sangre brillante en las heces.  El estreimiento dura ms de 4 das o empeora.  Siente dolor abdominal o rectal.  Las heces son delgadas como un lpiz.  Pierde peso de manera inexplicable. ASEGRESE DE QUE:   Comprende estas instrucciones.  Controlar su afeccin.  Recibir ayuda de inmediato si no mejora o si empeora. Document Released: 06/28/2007 Document Revised: 06/13/2013 [employer] Patient Information 2015 [employer]. This information is not intended to replace advice given to you by your health care provider. Make sure you discuss any questions you have with your health   care provider.  

## 2015-12-04 ENCOUNTER — Ambulatory Visit (INDEPENDENT_AMBULATORY_CARE_PROVIDER_SITE_OTHER): Payer: BLUE CROSS/BLUE SHIELD | Admitting: Obstetrics and Gynecology

## 2015-12-04 ENCOUNTER — Encounter: Payer: Self-pay | Admitting: Obstetrics and Gynecology

## 2015-12-04 ENCOUNTER — Other Ambulatory Visit (HOSPITAL_COMMUNITY)
Admission: RE | Admit: 2015-12-04 | Discharge: 2015-12-04 | Disposition: A | Discharge status: Home / Self Care | Payer: BLUE CROSS/BLUE SHIELD | Source: Ambulatory Visit | Attending: Obstetrics and Gynecology | Admitting: Obstetrics and Gynecology

## 2015-12-04 VITALS — BP 120/76 | Ht 63.0 in | Wt 167.5 lb

## 2015-12-04 DIAGNOSIS — Z1151 Encounter for screening for human papillomavirus (HPV): Secondary | ICD-10-CM | POA: Insufficient documentation

## 2015-12-04 DIAGNOSIS — Z01419 Encounter for gynecological examination (general) (routine) without abnormal findings: Secondary | ICD-10-CM | POA: Insufficient documentation

## 2015-12-04 MED ORDER — MICONAZOLE NITRATE 2 % VA CREA: 1.0000 | TOPICAL_CREAM | Freq: Every day | VAGINAL | Status: DC

## 2015-12-04 NOTE — Progress Notes (Signed)
Patient ID: Zoe Garcia, female   DOB: 30-Aug-1965, 50 y.o.   MRN: [id number]  Assessment:  Annual Gyn Exam Occasional vaginal dryness and itching   Plan:  1. pap smear done, next pap due 3 years 2. return annually or prn 3    Annual mammogram advised 4   Will rx Monistat 7 to be used prn  Subjective:  Zoe GailsOlga M Garcia is a 50 y.o. female No obstetric history on file. who presents for annual exam. No LMP recorded. The patient has no complaints today. Pt notes occasional vaginal dryness and itching. Denies fever.  The following portions of the patient's history were reviewed and updated as appropriate: allergies, current medications, past family history, past medical history, past social history, past surgical history and problem list.  Past Medical History  Diagnosis Date  . Chronic constipation   . Early menopause   . Globus sensation 2009    NL BPE 2008, 2010    Past Surgical History  Procedure Laterality Date  . Esophagogastroduodenoscopy  05/09    NL ESO BX     No current outpatient prescriptions on file.  Review of Systems Constitutional: negative Gastrointestinal: negative Genitourinary: negative  Objective:  BP 120/76 mmHg  Ht 5\' 3"  (1.6 m)  Wt 167 lb 8 oz (75.978 kg)  BMI 29.68 kg/m2   BMI: Body mass index is 29.68 kg/(m^2).  General Appearance: Alert, appropriate appearance for age. No acute distress HEENT: Grossly normal Neck / Thyroid:  Cardiovascular: RRR; normal S1, S2, no murmur Lungs: CTA bilaterally Back: No CVAT Breast Exam: No masses or nodes. No dimpling, nipple retraction or discharge. Gastrointestinal: Soft, non-tender, no masses or organomegaly Pelvic Exam:  Vaginal: normal mucosa without prolapse or lesions, normal without tenderness, induration or masses and normal rugae, good anterior and posterior support  Cervix: normal appearance; tiny, atrophic s/p menopause Adnexa: normal bimanual exam Uterus: normal single, non-tender,  small Rectovaginal: good sphincter tone, no masses and guaiac negative Lymphatic Exam: Non-palpable nodes in neck, clavicular, axillary, or inguinal regions  Skin: no rash or abnormalities Neurologic: Normal gait and speech, no tremor  Psychiatric: Alert and oriented, appropriate affect.  Urinalysis:Not done Guaiac negative  Christin BachJohn Tyger Oka. MD Pgr [phone] 3:29 PM    By signing my name below, I, Marisue HumbleMichelle Chaffee, attest that this documentation has been prepared under the direction and in the presence of Tilda BurrowJohn V Lilyahna Sirmon, MD . Electronically Signed: Marisue HumbleMichelle Chaffee, Scribe. 12/04/2015. 3:32 PM.  I personally performed the services described in this documentation, which was SCRIBED in my presence. The recorded information has been reviewed and considered accurate. It has been edited as necessary during review. Tilda BurrowFERGUSON,Izel Eisenhardt V, MD

## 2015-12-04 NOTE — Addendum Note (Signed)
Addended by: Richardson ChiquitoRAVIS, ASHLEY M on: 12/04/2015 04:14 PM   Modules accepted: Orders

## 2015-12-06 LAB — CYTOLOGY - PAP

## 2016-06-11 ENCOUNTER — Observation Stay (HOSPITAL_COMMUNITY)
Admission: EM | Admit: 2016-06-11 | Discharge: 2016-06-12 | Discharge status: Against Medical Advice | Payer: BLUE CROSS/BLUE SHIELD | Attending: Emergency Medicine | Admitting: Emergency Medicine

## 2016-06-11 ENCOUNTER — Emergency Department (HOSPITAL_COMMUNITY): Payer: BLUE CROSS/BLUE SHIELD

## 2016-06-11 ENCOUNTER — Encounter (HOSPITAL_COMMUNITY): Payer: Self-pay | Admitting: Emergency Medicine

## 2016-06-11 DIAGNOSIS — Z833 Family history of diabetes mellitus: Secondary | ICD-10-CM | POA: Diagnosis not present

## 2016-06-11 DIAGNOSIS — K5669 Other partial intestinal obstruction: Secondary | ICD-10-CM | POA: Diagnosis not present

## 2016-06-11 DIAGNOSIS — Z880 Allergy status to penicillin: Secondary | ICD-10-CM | POA: Insufficient documentation

## 2016-06-11 DIAGNOSIS — K56600 Partial intestinal obstruction, unspecified as to cause: Secondary | ICD-10-CM | POA: Diagnosis not present

## 2016-06-11 DIAGNOSIS — K429 Umbilical hernia without obstruction or gangrene: Secondary | ICD-10-CM | POA: Insufficient documentation

## 2016-06-11 DIAGNOSIS — Z8249 Family history of ischemic heart disease and other diseases of the circulatory system: Secondary | ICD-10-CM | POA: Insufficient documentation

## 2016-06-11 DIAGNOSIS — K5900 Constipation, unspecified: Secondary | ICD-10-CM | POA: Diagnosis present

## 2016-06-11 LAB — CBC WITH DIFFERENTIAL/PLATELET
Basophils Absolute: 0 10*3/uL (ref 0.0–0.1)
Basophils Relative: 0 %
EOS ABS: 0 10*3/uL (ref 0.0–0.7)
EOS PCT: 0 %
HCT: 39.3 % (ref 36.0–46.0)
Hemoglobin: 13.5 g/dL (ref 12.0–15.0)
LYMPHS ABS: 1.3 10*3/uL (ref 0.7–4.0)
LYMPHS PCT: 13 %
MCH: 30.3 pg (ref 26.0–34.0)
MCHC: 34.4 g/dL (ref 30.0–36.0)
MCV: 88.3 fL (ref 78.0–100.0)
MONO ABS: 0.2 10*3/uL (ref 0.1–1.0)
MONOS PCT: 2 %
Neutro Abs: 8.4 10*3/uL — ABNORMAL HIGH (ref 1.7–7.7)
Neutrophils Relative %: 85 %
PLATELETS: 257 10*3/uL (ref 150–400)
RBC: 4.45 MIL/uL (ref 3.87–5.11)
RDW: 13.2 % (ref 11.5–15.5)
WBC: 9.9 10*3/uL (ref 4.0–10.5)

## 2016-06-11 LAB — COMPREHENSIVE METABOLIC PANEL
ALK PHOS: 96 U/L (ref 38–126)
ALT: 23 U/L (ref 14–54)
AST: 25 U/L (ref 15–41)
Albumin: 4.3 g/dL (ref 3.5–5.0)
Anion gap: 7 (ref 5–15)
BUN: 18 mg/dL (ref 6–20)
CALCIUM: 9.6 mg/dL (ref 8.9–10.3)
CHLORIDE: 104 mmol/L (ref 101–111)
CO2: 26 mmol/L (ref 22–32)
CREATININE: 0.56 mg/dL (ref 0.44–1.00)
GFR calc Af Amer: >60 mL/min (ref >60)
GFR calc non Af Amer: >60 mL/min (ref >60)
GLUCOSE: 130 mg/dL — AB (ref 65–99)
Potassium: 3.2 mmol/L — ABNORMAL LOW (ref 3.5–5.1)
SODIUM: 137 mmol/L (ref 135–145)
Total Bilirubin: 0.8 mg/dL (ref 0.3–1.2)
Total Protein: 7.5 g/dL (ref 6.5–8.1)

## 2016-06-11 LAB — LIPASE, BLOOD: Lipase: 22 U/L (ref 11–51)

## 2016-06-11 MED ORDER — ONDANSETRON HCL 4 MG/2ML IJ SOLN
4.0000 mg | Freq: Once | INTRAMUSCULAR | Status: AC
  Administered 2016-06-11: 4 mg via INTRAVENOUS
  Filled 2016-06-11: qty 2

## 2016-06-11 MED ORDER — HYDROMORPHONE HCL 1 MG/ML IJ SOLN
0.5000 mg | Freq: Once | INTRAMUSCULAR | Status: AC
  Administered 2016-06-11: 0.5 mg via INTRAVENOUS
  Filled 2016-06-11: qty 1

## 2016-06-11 NOTE — ED Triage Notes (Signed)
Pt reports abdominal pain with n/v that started this am. Pt states pain starts around her umbilical region and radiates into her flanks.

## 2016-06-11 NOTE — ED Provider Notes (Signed)
[facility] DEPT Provider Note   CSN: [id number] Arrival date & time: 06/11/16  1941     History   Chief Complaint Chief Complaint  Patient presents with  . Abdominal Pain    HPI Zoe Garcia is a 50 y.o. female.  Patient is a 50 year old female who presents to the emergency department with a complaint of abdominal pain.  The patient does not speak much English, and the history is obtained from the patient through an interpreter.  The patient states that this problem started approximately 9 AM, with diffuse abdomen pain. Later during the day she felt as though her abdomen was distended. The pain then moved from the abdomen to the back. His been no recent injury to be reported. Patient is not had a previous history of this abdominal pain. Patient denies seeing any blood in the stools, or any tarry stools. She's had chills most of the day. She has not time measured a temperature however she denies any exposure to spoiled prepared food. The patient states that she's had 4 episodes of vomiting since 9 AM. She states that the degree of them have been within about 2 hours of her arriving at the emergency department. She denies anyone being sick at home. She's not had any recent trips out of the country.       Past Medical History:  Diagnosis Date  . Chronic constipation   . Early menopause   . Globus sensation 2009   NL BPE 2008, 2010    Patient Active Problem List   Diagnosis Date Noted  . Well woman exam with routine gynecological exam 12/04/2015  . Abdominal pain, other specified site 10/23/2010  . CONSTIPATION, CHRONIC 09/08/2010    Past Surgical History:  Procedure Laterality Date  . ESOPHAGOGASTRODUODENOSCOPY  05/09   NL ESO BX     OB History    Gravida Para Term Preterm AB Living   2 2 2          SAB TAB Ectopic Multiple Live Births                   Home Medications    Prior to Admission medications   Not on File    Family History History  reviewed. No pertinent family history.  Social History Social History  Substance Use Topics  . Smoking status: Never Smoker  . Smokeless tobacco: Never Used  . Alcohol use No     Allergies   Ampicillin   Review of Systems Review of Systems  Constitutional: Negative for activity change.       All ROS Neg except as noted in HPI  HENT: Negative for nosebleeds.   Eyes: Negative for photophobia and discharge.  Respiratory: Negative for cough, shortness of breath and wheezing.   Cardiovascular: Negative for chest pain and palpitations.  Gastrointestinal: Positive for abdominal pain, constipation and vomiting. Negative for blood in stool.  Genitourinary: Negative for dysuria, frequency and hematuria.  Musculoskeletal: Negative for arthralgias, back pain and neck pain.  Skin: Negative.   Neurological: Negative for dizziness, seizures and speech difficulty.  Psychiatric/Behavioral: Negative for confusion and hallucinations.  All other systems reviewed and are negative.    Physical Exam Updated Vital Signs BP 153/60 (BP Location: Left Arm)   Pulse (!) 56   Temp 98 F (36.7 C) (Oral)   Resp 20   Ht 5' 4.17" (1.63 m)   Wt 77.7 kg   SpO2 100%   BMI 29.24 kg/m   Physical Exam  Constitutional: She is oriented to person, place, and time. She appears well-developed and well-nourished.  Non-toxic appearance.  HENT:  Head: Normocephalic.  Right Ear: Tympanic membrane and external ear normal.  Left Ear: Tympanic membrane and external ear normal.  Eyes: EOM and lids are normal. Pupils are equal, round, and reactive to light.  Neck: Normal range of motion. Neck supple. Carotid bruit is not present.  Cardiovascular: Normal rate, regular rhythm, normal heart sounds, intact distal pulses and normal pulses.   Pulmonary/Chest: Breath sounds normal. No respiratory distress.  Abdominal: Soft. Bowel sounds are normal. There is no tenderness. There is no guarding.  Abdomen is diffusely  tender on. There is no hepatomegaly or splenomegaly appreciated. There is no mass or pulsatile mass appreciated. There's no CVA tenderness noted. Straight leg raise causes increase in abdominal pain.  Musculoskeletal: Normal range of motion.  Lymphadenopathy:       Head (right side): No submandibular adenopathy present.       Head (left side): No submandibular adenopathy present.    She has no cervical adenopathy.  Neurological: She is alert and oriented to person, place, and time. She has normal strength. No cranial nerve deficit or sensory deficit.  Skin: Skin is warm and dry.  Psychiatric: She has a normal mood and affect. Her speech is normal.  Nursing note and vitals reviewed.    ED Treatments / Results  Labs (all labs ordered are listed, but only abnormal results are displayed) Labs Reviewed  COMPREHENSIVE METABOLIC PANEL - Abnormal; Notable for the following:       Result Value   Potassium 3.2 (*)    Glucose, Bld 130 (*)    All other components within normal limits  CBC WITH DIFFERENTIAL/PLATELET - Abnormal; Notable for the following:    Neutro Abs 8.4 (*)    All other components within normal limits  LIPASE, BLOOD  URINALYSIS, ROUTINE W REFLEX MICROSCOPIC    EKG  EKG Interpretation None       Radiology No results found.  Procedures Procedures (including critical care time)  Medications Ordered in ED Medications - No data to display   Initial Impression / Assessment and Plan / ED Course  I have reviewed the triage vital signs and the nursing notes.  Pertinent labs & imaging results that were available during my care of the patient were reviewed by me and considered in my medical decision making (see chart for details).  Clinical Course     **I have reviewed nursing notes, vital signs, and all appropriate lab and imaging results for this patient.*  Final Clinical Impressions(s) / ED Diagnoses  Patient is a 50 year old Spanish-speaking female who  presents to the emergency department with diffuse abdominal pain. On CT scan suggests partial bowel obstruction. The competence of metabolic panel shows potassium 3 slightly low at 3.2, the glucose elevated at 1:30, otherwise within normal limits. The lipase is normal at 22. The complete blood count is also well within normal limits.   Case discussed with Dr. Maren ReamerYates-hospitalist. Patient to be admitted to observation-medical surgical unit.  Attempted to insert NG tube. Patient refused at this time.    Final diagnoses:  Partial intestinal obstruction, unspecified cause    New Prescriptions New Prescriptions   No medications on file     Ivery QualeHobson Imanii Gosdin, PA-C 06/12/16 0232    Glynn OctaveStephen Rancour, MD 06/12/16 [phone]

## 2016-06-12 ENCOUNTER — Encounter (HOSPITAL_COMMUNITY): Payer: Self-pay | Admitting: Internal Medicine

## 2016-06-12 DIAGNOSIS — K5669 Other partial intestinal obstruction: Secondary | ICD-10-CM

## 2016-06-12 DIAGNOSIS — K56600 Partial intestinal obstruction, unspecified as to cause: Secondary | ICD-10-CM | POA: Diagnosis present

## 2016-06-12 MED ORDER — HYDROMORPHONE HCL 1 MG/ML IJ SOLN
0.5000 mg | Freq: Once | INTRAMUSCULAR | Status: DC
  Filled 2016-06-12: qty 1

## 2016-06-12 MED ORDER — IOPAMIDOL (ISOVUE-300) INJECTION 61%
100.0000 mL | Freq: Once | INTRAVENOUS | Status: AC | PRN
  Administered 2016-06-12: 100 mL via INTRAVENOUS

## 2016-06-12 NOTE — ED Notes (Signed)
Pt left AMA. Was advised to return if pt wants to be checked again.

## 2016-06-12 NOTE — Consult Note (Signed)
Medical Consultation   Zoe Garcia  WJX:[id number]  DOB: [birth year]  DOA: 06/11/2016  PCP: No PCP Per Patient Consultants:  None Patient coming from: Home - lives with a friend; NOK: friend, [phone]  Requesting physician: PA Beverely Pace  Reason for consultation: Partial SBO   Chief Complaint: abdominal pain  HPI: Zoe Garcia is a 50 y.o. female with medical history significant of chronic constipation presenting with abdominal pain that developed this AM about 9.  Took Metamucil this AM and then felt bloated.  Last BM was this afternoon about 4, normal.  Vomited x 4 total, 2 times in the last 6 hours.  Unable to tolerate food/drink.  No h/o abdominal surgery.  No fever.  No cough.     ED Course: Per Danae Orleans: Patient is a 50 year old Spanish-speaking female who presents to the emergency department with diffuse abdominal pain. On CT scan suggests partial bowel obstruction. The competence of metabolic panel shows potassium 3 slightly low at 3.2, the glucose elevated at 1:30, otherwise within normal limits. The lipase is normal at 22. The complete blood count is also well within normal limits.  Case discussed with Dr. Maren Reamer. Patient to be admitted to observation-medical surgical unit.  Attempted to insert NG tube. Patient refused at this time.     Ambulatory Status:  ambulates without difficulty   Review of Systems: As per HPI; otherwise 10 point review of systems reviewed and negative.   ROS  Past Medical History: Past Medical History:  Diagnosis Date  . Chronic constipation   . Early menopause   . Globus sensation 2009   NL BPE 2008, 2010    Past Surgical History: Past Surgical History:  Procedure Laterality Date  . ESOPHAGOGASTRODUODENOSCOPY  05/09   NL ESO BX      Allergies:   Allergies  Allergen Reactions  . Ampicillin Hives     Social History:  reports that she has never smoked. She has never used smokeless  tobacco. She reports that she does not drink alcohol or use drugs.   Family History: Family History  Problem Relation Age of Onset  . Hypertension Mother   . Diabetes Father 9     Physical Exam: Vitals:   06/12/16 0000 06/12/16 0119 06/12/16 0130 06/12/16 0216  BP: 131/59 125/80 142/77 155/73  Pulse: (!) 53 (!) 57 60 61  Resp:   22 20  Temp:      TempSrc:      SpO2: 100% 97% 97% 100%  Weight:      Height:        Constitutional: Alert and awake, oriented x3, not in any acute distress. Eyes: PERLA, EOMI, irises appear normal, anicteric sclera,  ENMT: external ears and nose appear normal, normal hearing            Lips appears normal, oropharynx mucosa, tongue, posterior pharynx appear normal  Neck: neck appears normal, no masses, normal ROM, no thyromegaly, no JVD  CVS: S1-S2 clear, no murmur rubs or gallops, no LE edema, normal pedal pulses  Respiratory:  clear to auscultation bilaterally, no wheezing, rales or rhonchi. Respiratory effort normal. No accessory muscle use.  Abdomen: soft nontender, nondistended, hypoactive bowel sounds particularly in the upper quadrants, no hepatosplenomegaly, no hernias  Musculoskeletal: : no cyanosis, clubbing or edema noted bilaterally Neuro: Cranial nerves II-XII intact, strength, sensation, reflexes Psych: judgement and insight appear normal, stable mood and affect, mental  status Skin: no rashes or lesions or ulcers, no induration or nodules    Data reviewed:  I have personally reviewed following labs and imaging studies Labs:  CBC:  Recent Labs Lab 06/11/16 2211  WBC 9.9  NEUTROABS 8.4*  HGB 13.5  HCT 39.3  MCV 88.3  PLT 257    Basic Metabolic Panel:  Recent Labs Lab 06/11/16 2211  NA 137  K 3.2*  CL 104  CO2 26  GLUCOSE 130*  BUN 18  CREATININE 0.56  CALCIUM 9.6   GFR Estimated Creatinine Clearance: 84.2 mL/min (by C-G formula based on SCr of 0.56 mg/dL). Liver Function Tests:  Recent Labs Lab  06/11/16 2211  AST 25  ALT 23  ALKPHOS 96  BILITOT 0.8  PROT 7.5  ALBUMIN 4.3    Recent Labs Lab 06/11/16 2211  LIPASE 22   No results for input(s): AMMONIA in the last 168 hours. Coagulation profile No results for input(s): INR, PROTIME in the last 168 hours.  Cardiac Enzymes: No results for input(s): CKTOTAL, CKMB, CKMBINDEX, TROPONINI in the last 168 hours. BNP: Invalid input(s): POCBNP CBG: No results for input(s): GLUCAP in the last 168 hours. D-Dimer No results for input(s): DDIMER in the last 72 hours. Hgb A1c No results for input(s): HGBA1C in the last 72 hours. Lipid Profile No results for input(s): CHOL, HDL, LDLCALC, TRIG, CHOLHDL, LDLDIRECT in the last 72 hours. Thyroid function studies No results for input(s): TSH, T4TOTAL, T3FREE, THYROIDAB in the last 72 hours.  Invalid input(s): FREET3 Anemia work up No results for input(s): VITAMINB12, FOLATE, FERRITIN, TIBC, IRON, RETICCTPCT in the last 72 hours. Urinalysis No results found for: COLORURINE, APPEARANCEUR, LABSPEC, PHURINE, GLUCOSEU, HGBUR, BILIRUBINUR, KETONESUR, PROTEINUR, UROBILINOGEN, NITRITE, LEUKOCYTESUR   Microbiology No results found for this or any previous visit (from the past 240 hour(s)).     Inpatient Medications:   Scheduled Meds: . HYDROmorphone  0.5 mg Intravenous Once   Continuous Infusions:   Radiological Exams on Admission: Ct Abdomen Pelvis W Contrast  Result Date: 06/12/2016 CLINICAL DATA:  50 year old female with diffuse abdominal pain, nausea vomiting. EXAM: CT ABDOMEN AND PELVIS WITH CONTRAST TECHNIQUE: Multidetector CT imaging of the abdomen and pelvis was performed using the standard protocol following bolus administration of intravenous contrast. CONTRAST:  100mL ISOVUE-300 IOPAMIDOL (ISOVUE-300) INJECTION 61% COMPARISON:  None. FINDINGS: Lower chest: Bibasilar hazy densities, likely atelectatic changes. No intra-abdominal free air.  Small free fluid within the  pelvis. Hepatobiliary: No focal liver abnormality is seen. No gallstones, gallbladder wall thickening, or biliary dilatation. Pancreas: Unremarkable. No pancreatic ductal dilatation or surrounding inflammatory changes. Spleen: Normal in size without focal abnormality. Adrenals/Urinary Tract: The adrenal glands appear unremarkable. Subcentimeter right renal hypodense lesion is not well characterized but most likely represents a cyst. There is no hydronephrosis on either side. The visualized ureters and urinary bladder appear unremarkable. Stomach/Bowel: Oral contrast noted within the stomach and multiple loops of proximal small bowel. There is mild dilatation of proximal small bowel loops measuring up to 3.1 cm diameter. The distal small bowel and terminal ileum are collapsed. A transition zone is noted in the left hemiabdomen (series 2, image 37 and coronal image 39). There is moderate stool in the proximal colon. Normal appendix. Vascular/Lymphatic: No significant vascular findings are present. No enlarged abdominal or pelvic lymph nodes. Reproductive: The uterus is retroflexed. The ovaries are grossly unremarkable. Other: Small fat containing umbilical hernia. Musculoskeletal: A subcentimeter sclerotic focus involving the L3 vertebral most likely a bone island. The  osseous structures are otherwise intact. IMPRESSION: Early small-bowel obstruction with transition zone in the left mid hemiabdomen. Follow-up recommended. Electronically Signed   By: Elgie CollardArash  Radparvar M.D.   On: 06/12/2016 01:03    Impression/Recommendations Active Problems:   Partial bowel obstruction  Patient was to be placed in observation for this SBO.  Goal was for NG tube, if possible.  Bowel rest, IVF, morphine for pain, Zofran for nausea.  If not improving, would then need general surgery consultation.  I spent 45 minutes with the patient and on the language line attempting to explain the problem and the plan.   After a prolonged  explanation and many questions, the patient decided that she will leave against medical advice.   Thank you for this consultation.    Time Spent: 55 minutes  Jonah BlueJennifer Noa Galvao M.D. [facility] 06/12/2016, 2:59 AM

## 2016-06-12 NOTE — ED Notes (Signed)
Floor called & was advised pt had left AMA.

## 2016-06-12 NOTE — ED Notes (Signed)
Attempted to start to place pt NG in the right nare, pt grabbed my arm and pulled it away. Again explained importance of procedure, pt let me start to place NG, did not even advance tube, pt says she is not having the tube and only wants the pain meds.

## 2016-06-12 NOTE — ED Notes (Signed)
hospitalist is at the bedside for admission assessment and orders, will transport to floor when her assessment is complete

## 2016-06-18 ENCOUNTER — Telehealth: Payer: Self-pay

## 2016-06-18 NOTE — Telephone Encounter (Signed)
Talked with her friend Romelia and told her that [facility] to keep her appointment for Wednesday but if she gets worst to go to the ER.

## 2016-06-18 NOTE — Telephone Encounter (Signed)
I spoke to Dr. Darrick PennaFields and she said OK to give pt the first available appt. She is aware that pt had been given an appt on 06/24/2016 with Lewie LoronAnna Boone, NP.

## 2016-06-18 NOTE — Telephone Encounter (Signed)
Pt's phone number [phone] and her friend's name is [name] # [phone].

## 2016-06-18 NOTE — Telephone Encounter (Signed)
Pt came by the office c/o abdominal pain. Recently seen at ED and CT showed a partial small bowel obstruction.  Pt complains of abdominal pain in the center and it radiates to each side at times.  She is not able to eat much, because of the pain that it causes.  She still has BM's, last one was yesterday.  No fever.   She has been scheduled an OV with Lewie LoronAnna Boone, NP on 06/25/2015 at 3:30 pm.  I told her to go to the ED if her pain becomes severe.  ( Pt left AMA when she was at the ED on 06/11/2016).   Routing to Dr. Darrick PennaFields to advise!

## 2016-06-19 NOTE — Telephone Encounter (Signed)
CALLED PT'S friend. Heard a vavy on the phone but no one responded then got a message that the mailbox is full and can't accept messages.Called patient TO DISCUSS RESULTS-LVM.PT should go to nearest ED if she is having pain AND BE ADMITTED TO SEE IF SBO WILL RESOLVE WITHOUT SURGERY. . WE HAVE NOTHING TO OFFER HER IN THE OFFICE THAT'S GOING TO MAKE THE SYMPTOMS BETTER. PT ASKED TO CALL WITH QUESTIONS.

## 2016-06-23 NOTE — Telephone Encounter (Signed)
Zoe Garcia is aware that pt is on her schedule for tomorrow. She said that if pt is still having severe pain she should go to the ED, if not she can follow up with her tomorrow.  I called and LMOM for the pt to return call.  I called the friends's phone and it sound like a toddler answered the phone and was mumbling. I kept waiting and saying hello, no one ever answered.

## 2016-06-24 ENCOUNTER — Ambulatory Visit: Payer: PRIVATE HEALTH INSURANCE | Admitting: Gastroenterology

## 2016-06-24 ENCOUNTER — Telehealth: Payer: Self-pay

## 2016-06-24 NOTE — Telephone Encounter (Signed)
Romelia called to cancel patients appointment because she is out of the country.

## 2017-03-01 ENCOUNTER — Other Ambulatory Visit: Payer: BLUE CROSS/BLUE SHIELD | Admitting: Obstetrics and Gynecology

## 2017-03-01 ENCOUNTER — Ambulatory Visit (INDEPENDENT_AMBULATORY_CARE_PROVIDER_SITE_OTHER): Payer: BLUE CROSS/BLUE SHIELD | Admitting: Obstetrics and Gynecology

## 2017-03-01 ENCOUNTER — Encounter (INDEPENDENT_AMBULATORY_CARE_PROVIDER_SITE_OTHER): Payer: Self-pay

## 2017-03-01 ENCOUNTER — Encounter: Payer: Self-pay | Admitting: Obstetrics and Gynecology

## 2017-03-01 VITALS — BP 122/70 | HR 65 | Ht 65.0 in | Wt 144.6 lb

## 2017-03-01 DIAGNOSIS — Z01419 Encounter for gynecological examination (general) (routine) without abnormal findings: Secondary | ICD-10-CM | POA: Diagnosis not present

## 2017-03-01 DIAGNOSIS — Z1212 Encounter for screening for malignant neoplasm of rectum: Secondary | ICD-10-CM | POA: Diagnosis not present

## 2017-03-01 LAB — HEMOCCULT GUIAC POC 1CARD (OFFICE): Fecal Occult Blood, POC: NEGATIVE

## 2017-03-01 MED ORDER — POLYETHYLENE GLYCOL 3350 17 GM/SCOOP PO POWD: 1.0000 | Freq: Once | ORAL | Status: DC

## 2017-03-01 NOTE — Patient Instructions (Signed)
Centrum Silver is a good multivitamin.

## 2017-03-01 NOTE — Progress Notes (Signed)
Patient ID: Zoe Garcia, female   DOB: 1965-11-07, 51 y.o.   MRN: [id number]   Assessment:  Annual Gyn Exam  postmenopausal symptoms Language barrier with translator Frutoso ChaseBetty Shirley.[employer] Education officer, environmentaltranslator service.[employer] [place]. Tongue sensitivity Constipation Hx fatty liver  Plan:  1. pap smear done, next pap due  2. return annually or prn 3    Annual mammogram advised 4.   Take a stool softener and MiraLAX Subjective:  Zoe Garcia is a 51 y.o. female G2P2000 who presents for annual exam. No LMP recorded. Patient is postmenopausal. The patient has complaints today of constipation, gas, and possible hemorrhoids. She also notes increased intermittent, sadness, that doesn't prevent her from her normal day to day activities. She was told it could be related to her menopause, as well as skin changes, and hair loss. Pt was prescribed medication for [place] constipation, gas and [place] but has run out of her prescriptions. She also was diagnosed with a fat liver in [place]. Pt notes her tongue burns and was wondering if it was related to her fat liver. Pt brought her sonogram for her liver. She reports occasional pain to her stomach after she eats.   The following portions of the patient's history were reviewed and updated as appropriate: allergies, current medications, past family history, past medical history, past social history, past surgical history and problem list. Past Medical History:  Diagnosis Date  . Chronic constipation   . Early menopause   . Globus sensation 2009   NL BPE 2008, 2010    Past Surgical History:  Procedure Laterality Date  . ESOPHAGOGASTRODUODENOSCOPY  05/09   NL ESO BX      Current Outpatient Prescriptions:  .  milk thistle 175 MG tablet, Take 175 mg by mouth daily., Disp: , Rfl:          Review of Systems Constitutional: negative Gastrointestinal: negative Genitourinary:  Weight loss due to dieting  Objective:  BP 122/70  (BP Location: Right Arm, Patient Position: Sitting, Cuff Size: Normal)   Pulse 65   Ht 5\' 5"  (1.651 m)   Wt 144 lb 9.6 oz (65.6 kg)   BMI 24.06 kg/m    BMI: Body mass index is 24.06 kg/m.  General Appearance: Alert, appropriate appearance for age. No acute distress HEENT: Grossly normal, thinning of her tongue Neck / Thyroid:  Cardiovascular: RRR; normal S1, S2, no murmur Lungs: CTA bilaterally Back: No CVAT Breast Exam: No dimpling, nipple retraction or discharge. No masses or nodes., Normal to inspection, Normal breast tissue bilaterally and No masses or nodes.No dimpling, nipple retraction or discharge. Gastrointestinal: Soft, non-tender, no masses or organomegaly Pelvic Exam: Vulva and vagina appear normal. Bimanual exam reveals normal uterus and adnexa.  Tissues are thinned out and looks post menopausal.   Anus: one little crack in the skin and no blood in BM Lymphatic Exam: Non-palpable nodes in neck, clavicular, axillary, or inguinal regions  Skin: no rash or abnormalities Neurologic: Normal gait and speech, no tremor  Psychiatric: Alert and oriented, appropriate affect.  Urinalysis:Not done  Christin BachJohn Kordel Leavy. MD Pgr [phone] 10:07 AM   By signing my name below, I, Diona BrownerJennifer Gorman, attest that this documentation has been prepared under the direction and in the presence of Tilda BurrowFerguson, Jimie Kuwahara V, MD. Electronically Signed: Diona BrownerJennifer Gorman, Medical Scribe. 03/01/17. 9:55 AM.  I personally performed the services described in this documentation, which was SCRIBED in my presence. The recorded information has been reviewed and considered accurate. It has been edited  as necessary during review. Jonnie Kind, MD

## 2017-05-11 DIAGNOSIS — Z711 Person with feared health complaint in whom no diagnosis is made: Secondary | ICD-10-CM | POA: Diagnosis not present

## 2017-05-31 ENCOUNTER — Ambulatory Visit: Payer: BLUE CROSS/BLUE SHIELD | Admitting: Obstetrics and Gynecology

## 2017-07-11 ENCOUNTER — Emergency Department (HOSPITAL_COMMUNITY): Payer: BLUE CROSS/BLUE SHIELD

## 2017-07-11 ENCOUNTER — Emergency Department (HOSPITAL_COMMUNITY)
Admission: EM | Admit: 2017-07-11 | Discharge: 2017-07-11 | Disposition: A | Discharge status: Home / Self Care | Payer: BLUE CROSS/BLUE SHIELD | Attending: Emergency Medicine | Admitting: Emergency Medicine

## 2017-07-11 ENCOUNTER — Other Ambulatory Visit: Payer: Self-pay

## 2017-07-11 ENCOUNTER — Encounter (HOSPITAL_COMMUNITY): Payer: Self-pay | Admitting: Emergency Medicine

## 2017-07-11 DIAGNOSIS — R42 Dizziness and giddiness: Secondary | ICD-10-CM | POA: Diagnosis not present

## 2017-07-11 DIAGNOSIS — Z79899 Other long term (current) drug therapy: Secondary | ICD-10-CM | POA: Insufficient documentation

## 2017-07-11 DIAGNOSIS — R079 Chest pain, unspecified: Secondary | ICD-10-CM | POA: Diagnosis not present

## 2017-07-11 DIAGNOSIS — R001 Bradycardia, unspecified: Secondary | ICD-10-CM | POA: Diagnosis not present

## 2017-07-11 LAB — I-STAT BETA HCG BLOOD, ED (MC, WL, AP ONLY)

## 2017-07-11 LAB — CBC
HCT: 38.8 % (ref 36.0–46.0)
HEMOGLOBIN: 13.1 g/dL (ref 12.0–15.0)
MCH: 30.5 pg (ref 26.0–34.0)
MCHC: 33.8 g/dL (ref 30.0–36.0)
MCV: 90.4 fL (ref 78.0–100.0)
PLATELETS: 291 10*3/uL (ref 150–400)
RBC: 4.29 MIL/uL (ref 3.87–5.11)
RDW: 12.7 % (ref 11.5–15.5)
WBC: 8.3 10*3/uL (ref 4.0–10.5)

## 2017-07-11 LAB — BASIC METABOLIC PANEL
ANION GAP: 13 (ref 5–15)
BUN: 10 mg/dL (ref 6–20)
CALCIUM: 10.1 mg/dL (ref 8.9–10.3)
CO2: 24 mmol/L (ref 22–32)
CREATININE: 0.61 mg/dL (ref 0.44–1.00)
Chloride: 106 mmol/L (ref 101–111)
GFR calc non Af Amer: >60 mL/min (ref >60)
Glucose, Bld: 106 mg/dL — ABNORMAL HIGH (ref 65–99)
Potassium: 3.4 mmol/L — ABNORMAL LOW (ref 3.5–5.1)
SODIUM: 143 mmol/L (ref 135–145)

## 2017-07-11 LAB — I-STAT TROPONIN, ED: TROPONIN I, POC: 0 ng/mL (ref 0.00–0.08)

## 2017-07-11 MED ORDER — SODIUM CHLORIDE 0.9 % IV BOLUS (SEPSIS)
1000.0000 mL | Freq: Once | INTRAVENOUS | Status: AC
  Administered 2017-07-11: 1000 mL via INTRAVENOUS

## 2017-07-11 MED ORDER — ASPIRIN 81 MG PO CHEW
324.0000 mg | CHEWABLE_TABLET | Freq: Once | ORAL | Status: AC
  Administered 2017-07-11: 324 mg via ORAL
  Filled 2017-07-11: qty 4

## 2017-07-11 MED ORDER — MECLIZINE HCL 25 MG PO TABS: 25.0000 mg | ORAL_TABLET | Freq: Three times a day (TID) | ORAL | 0 refills | Status: DC | PRN

## 2017-07-11 MED ORDER — SODIUM CHLORIDE 0.9 % IV SOLN: INTRAVENOUS | Status: DC

## 2017-07-11 NOTE — ED Provider Notes (Signed)
[facility] EMERGENCY DEPARTMENT Provider Note   CSN: [id number] Arrival date & time: 07/11/17  1559    Spanish language translator was used History   Education officer, museum Complaint  Patient presents with  . Dizziness    HPI Zoe Garcia is a 52 y.o. female.  HPI Patient presents to the emergency room for evaluation of dizziness.  Patient states her symptoms started last Thursday.  She is felt lightheaded and feels like she is falling to either side when she is walking.  She feels like her legs are getting weak.  She is also had some sensation of anxiety as well as some vague discomfort in the center of her chest.  She denies any shortness of breath.  No nausea or vomiting or diarrhea.  No fevers.  No difficulty with her speech no difficulty with grip strength.  She has not fallen or injured herself. Past Medical History:  Diagnosis Date  . Chronic constipation   . Early menopause   . Globus sensation 2009   NL BPE 2008, 2010    Patient Active Problem List   Diagnosis Date Noted  . Partial bowel obstruction (HCC) 06/12/2016  . Well woman exam with routine gynecological exam 12/04/2015  . Abdominal pain, other specified site 10/23/2010  . CONSTIPATION, CHRONIC 09/08/2010    Past Surgical History:  Procedure Laterality Date  . ESOPHAGOGASTRODUODENOSCOPY  05/09   NL ESO BX     OB History    Gravida Para Term Preterm AB Living   2 2 2          SAB TAB Ectopic Multiple Live Births                   Home Medications    Prior to Admission medications   Medication Sig Start Date End Date Taking? Authorizing Provider  milk thistle 175 MG tablet Take 1,000 mg by mouth daily.    Yes [provider]  meclizine (ANTIVERT) 25 MG tablet Take 1 tablet (25 mg total) by mouth 3 (three) times daily as needed for dizziness. 07/11/17   Linwood Dibbles, MD    Family History Family History  Problem Relation Age of Onset  . Hypertension Mother   . Diabetes Father 28     Social History Social History   Tobacco Use  . Smoking status: Never Smoker  . Smokeless tobacco: Never Used  Substance Use Topics  . Alcohol use: No  . Drug use: No     Allergies   Ampicillin   Review of Systems Review of Systems  All other systems reviewed and are negative.    Physical Exam Updated Vital Signs BP 107/79   Pulse 62   Temp 98 F (36.7 C) (Oral)   Resp 20   Ht 1.63 m (5' 4.17")   Wt 65.3 kg (144 lb)   SpO2 100%   BMI 24.58 kg/m   Physical Exam  Constitutional: She is oriented to person, place, and time. She appears well-developed and well-nourished. No distress.  HENT:  Head: Normocephalic and atraumatic.  Right Ear: External ear normal.  Left Ear: External ear normal.  Mouth/Throat: Oropharynx is clear and moist.  Eyes: Conjunctivae are normal. Right eye exhibits no discharge. Left eye exhibits no discharge. No scleral icterus.  Neck: Neck supple. No tracheal deviation present.  Cardiovascular: Normal rate, regular rhythm and intact distal pulses.  Pulmonary/Chest: Effort normal and breath sounds normal. No stridor. No respiratory distress. She has no wheezes. She has no  rales.  Abdominal: Soft. Bowel sounds are normal. She exhibits no distension. There is no tenderness. There is no rebound and no guarding.  Musculoskeletal: She exhibits no edema or tenderness.  Neurological: She is alert and oriented to person, place, and time. She has normal strength. No cranial nerve deficit (no facial droop, extraocular movements intact, no slurred speech) or sensory deficit. She exhibits normal muscle tone. She displays no seizure activity. Coordination normal.  No pronator drift bilateral upper extrem, able to hold both legs off bed for 5 seconds, sensation intact in all extremities, no visual field cuts, no left or right sided neglect, normal finger-nose exam bilaterally, no nystagmus noted   Skin: Skin is warm and dry. No rash noted.  Psychiatric: She  has a normal mood and affect.  Nursing note and vitals reviewed.    ED Treatments / Results  Labs (all labs ordered are listed, but only abnormal results are displayed) Labs Reviewed  BASIC METABOLIC PANEL - Abnormal; Notable for the following components:      Result Value   Potassium 3.4 (*)    Glucose, Bld 106 (*)    All other components within normal limits  CBC  I-STAT BETA HCG BLOOD, ED ([facility], [facility], [facility] ONLY)  I-STAT TROPONIN, ED    EKG  EKG Interpretation  Date/Time:  Sunday July 11 2017 16:15:19 EST Ventricular Rate:  59 PR Interval:  120 QRS Duration: 78 QT Interval:  448 QTC Calculation: 443 R Axis:   16 Text Interpretation:  Sinus bradycardia Otherwise normal ECG No old tracing to compare Confirmed by Linwood DibblesKnapp, Laylonie Marzec [phone]([id number]) on 07/11/2017 4:24:13 PM       Radiology Dg Chest 2 View  Result Date: 07/11/2017 CLINICAL DATA:  Patient reports dizziness and loss of appetite since Thursday. Chest pain EXAM: CHEST  2 VIEW COMPARISON:  None. FINDINGS: Normal mediastinum and cardiac silhouette. Normal pulmonary vasculature. No evidence of effusion, infiltrate, or pneumothorax. No acute bony abnormality. IMPRESSION: Normal chest radiograph. Electronically Signed   By: Genevive BiStewart  Edmunds M.D.   On: 07/11/2017 18:22   Ct Head Wo Contrast  Result Date: 07/11/2017 CLINICAL DATA:  52 year old female with dizziness and decreased appetite since Thursday. EXAM: CT HEAD WITHOUT CONTRAST TECHNIQUE: Contiguous axial images were obtained from the base of the skull through the vertex without intravenous contrast. COMPARISON:  None. FINDINGS: Brain: No evidence of acute infarction, hemorrhage, hydrocephalus, extra-axial collection or mass lesion/mass effect. Vascular: No hyperdense vessel or unexpected calcification. Skull: Normal. Negative for fracture or focal lesion. Sinuses/Orbits: No acute finding. Other: None. IMPRESSION: Negative head CT. Electronically Signed   By: Malachy MoanHeath  McCullough M.D.   On:  07/11/2017 18:29    Procedures Procedures (including critical care time)  Medications Ordered in ED Medications  sodium chloride 0.9 % bolus 1,000 mL (1,000 mLs Intravenous New Bag/Given 07/11/17 1804)    And  0.9 %  sodium chloride infusion (not administered)  aspirin chewable tablet 324 mg (324 mg Oral Given 07/11/17 1804)     Initial Impression / Assessment and Plan / ED Course  I have reviewed the triage vital signs and the nursing notes.  Pertinent labs & imaging results that were available during my care of the patient were reviewed by me and considered in my medical decision making (see chart for details).   Patient presented to the emergency room with complaints of dizziness.  She has a normal neurologic exam.  No clear vertigo type symptoms.  Hydration is reassuring.  She does not  appear dehydrated or anemic.  Laboratory tests and CT scan are reassuring.  This is not clearly vertigo but I will have her try a course of meclizine.  Recommend follow-up with primary care doctor.  At this time there does not appear to be any evidence of an acute emergency medical condition and the patient appears stable for discharge with appropriate outpatient follow up.   Final Clinical Impressions(s) / ED Diagnoses   Final diagnoses:  Dizziness    ED Discharge Orders        Ordered    meclizine (ANTIVERT) 25 MG tablet  3 times daily PRN     07/11/17 Herbie Baltimore       Linwood Dibbles, MD 07/11/17 1856

## 2017-07-11 NOTE — Discharge Instructions (Signed)
Follow-up with your primary care doctor, the medication as prescribed to see if it helps with your dizziness

## 2017-07-11 NOTE — ED Triage Notes (Signed)
Patient reports dizziness and loss of appetite since Thursday. No n/v/d. No fever.

## 2017-11-26 ENCOUNTER — Ambulatory Visit: Payer: BLUE CROSS/BLUE SHIELD | Admitting: Emergency Medicine

## 2017-12-25 DIAGNOSIS — L249 Irritant contact dermatitis, unspecified cause: Secondary | ICD-10-CM | POA: Diagnosis not present

## 2017-12-25 DIAGNOSIS — Z6827 Body mass index (BMI) 27.0-27.9, adult: Secondary | ICD-10-CM | POA: Diagnosis not present

## 2018-01-24 ENCOUNTER — Telehealth: Payer: Self-pay | Admitting: Orthopedic Surgery

## 2018-01-24 NOTE — Telephone Encounter (Signed)
Patient called to inquire about scheduling an appointment as a new patient. Said that she is having pain in her bones, and pain in both knees, legs, and a little bit in her back. States last treated at [facility] [place], Dr Wende CreaseGuarino. Discussed primary care first, to address the multiple symptoms, then if primary care needs to refer, for a specific problem, we can set up appointment.  York SpanielSaid does not yet have a primary care but is looking.

## 2018-02-14 DIAGNOSIS — Z6828 Body mass index (BMI) 28.0-28.9, adult: Secondary | ICD-10-CM | POA: Diagnosis not present

## 2018-02-14 DIAGNOSIS — K760 Fatty (change of) liver, not elsewhere classified: Secondary | ICD-10-CM | POA: Diagnosis not present

## 2018-02-14 DIAGNOSIS — M791 Myalgia, unspecified site: Secondary | ICD-10-CM | POA: Diagnosis not present

## 2018-02-15 ENCOUNTER — Other Ambulatory Visit: Payer: Self-pay | Admitting: Internal Medicine

## 2018-02-15 DIAGNOSIS — Z1231 Encounter for screening mammogram for malignant neoplasm of breast: Secondary | ICD-10-CM

## 2018-02-23 DIAGNOSIS — M791 Myalgia, unspecified site: Secondary | ICD-10-CM | POA: Diagnosis not present

## 2018-02-23 DIAGNOSIS — K760 Fatty (change of) liver, not elsewhere classified: Secondary | ICD-10-CM | POA: Diagnosis not present

## 2018-02-23 DIAGNOSIS — L249 Irritant contact dermatitis, unspecified cause: Secondary | ICD-10-CM | POA: Diagnosis not present

## 2018-02-23 DIAGNOSIS — Z6827 Body mass index (BMI) 27.0-27.9, adult: Secondary | ICD-10-CM | POA: Diagnosis not present

## 2018-03-10 DIAGNOSIS — K760 Fatty (change of) liver, not elsewhere classified: Secondary | ICD-10-CM | POA: Diagnosis not present

## 2018-03-10 DIAGNOSIS — Z0001 Encounter for general adult medical examination with abnormal findings: Secondary | ICD-10-CM | POA: Diagnosis not present

## 2018-03-10 DIAGNOSIS — Z6828 Body mass index (BMI) 28.0-28.9, adult: Secondary | ICD-10-CM | POA: Diagnosis not present

## 2018-03-10 DIAGNOSIS — M791 Myalgia, unspecified site: Secondary | ICD-10-CM | POA: Diagnosis not present

## 2018-03-16 ENCOUNTER — Ambulatory Visit (HOSPITAL_COMMUNITY): Payer: BLUE CROSS/BLUE SHIELD

## 2018-04-13 DIAGNOSIS — J029 Acute pharyngitis, unspecified: Secondary | ICD-10-CM | POA: Diagnosis not present

## 2018-04-13 DIAGNOSIS — J069 Acute upper respiratory infection, unspecified: Secondary | ICD-10-CM | POA: Diagnosis not present

## 2018-05-16 ENCOUNTER — Ambulatory Visit (HOSPITAL_COMMUNITY)
Admission: RE | Admit: 2018-05-16 | Discharge: 2018-05-16 | Disposition: A | Discharge status: Home / Self Care | Payer: BLUE CROSS/BLUE SHIELD | Source: Ambulatory Visit | Attending: Internal Medicine | Admitting: Internal Medicine

## 2018-05-16 DIAGNOSIS — Z1231 Encounter for screening mammogram for malignant neoplasm of breast: Secondary | ICD-10-CM | POA: Diagnosis not present

## 2018-06-21 ENCOUNTER — Ambulatory Visit: Payer: BLUE CROSS/BLUE SHIELD | Admitting: Obstetrics and Gynecology

## 2018-06-21 DIAGNOSIS — M791 Myalgia, unspecified site: Secondary | ICD-10-CM | POA: Diagnosis not present

## 2018-06-21 DIAGNOSIS — R7303 Prediabetes: Secondary | ICD-10-CM | POA: Diagnosis not present

## 2018-06-21 DIAGNOSIS — R030 Elevated blood-pressure reading, without diagnosis of hypertension: Secondary | ICD-10-CM | POA: Diagnosis not present

## 2018-06-30 DIAGNOSIS — M542 Cervicalgia: Secondary | ICD-10-CM | POA: Diagnosis not present

## 2018-06-30 DIAGNOSIS — R51 Headache: Secondary | ICD-10-CM | POA: Diagnosis not present

## 2018-06-30 DIAGNOSIS — R030 Elevated blood-pressure reading, without diagnosis of hypertension: Secondary | ICD-10-CM | POA: Diagnosis not present

## 2018-09-05 DIAGNOSIS — N390 Urinary tract infection, site not specified: Secondary | ICD-10-CM | POA: Diagnosis not present

## 2018-09-05 DIAGNOSIS — N3000 Acute cystitis without hematuria: Secondary | ICD-10-CM | POA: Diagnosis not present

## 2018-09-05 DIAGNOSIS — J069 Acute upper respiratory infection, unspecified: Secondary | ICD-10-CM | POA: Diagnosis not present

## 2018-09-05 DIAGNOSIS — J029 Acute pharyngitis, unspecified: Secondary | ICD-10-CM | POA: Diagnosis not present

## 2018-09-05 DIAGNOSIS — R350 Frequency of micturition: Secondary | ICD-10-CM | POA: Diagnosis not present

## 2018-09-08 ENCOUNTER — Ambulatory Visit: Payer: BLUE CROSS/BLUE SHIELD | Admitting: Adult Health

## 2018-09-08 ENCOUNTER — Other Ambulatory Visit: Payer: Self-pay

## 2018-09-08 ENCOUNTER — Encounter: Payer: Self-pay | Admitting: Adult Health

## 2018-09-08 VITALS — BP 135/69 | HR 59 | Ht 65.0 in | Wt 157.0 lb

## 2018-09-08 DIAGNOSIS — N952 Postmenopausal atrophic vaginitis: Secondary | ICD-10-CM | POA: Diagnosis not present

## 2018-09-08 DIAGNOSIS — N898 Other specified noninflammatory disorders of vagina: Secondary | ICD-10-CM | POA: Diagnosis not present

## 2018-09-08 MED ORDER — ESTROGENS, CONJUGATED 0.625 MG/GM VA CREA: TOPICAL_CREAM | VAGINAL | 2 refills | Status: DC

## 2018-09-08 NOTE — Progress Notes (Signed)
Patient ID: Zoe Garcia, female   DOB: 07/02/1965, 53 y.o.   MRN: [id number] History of Present Illness: Yasleen is a 53 year old Hispanic female, separated, PM in complaining of vaginal dryness.She had normal pap in 2018 with Dr Emelda Fear, she said she had vaginal dryness then too.   Current Medications, Allergies, Past Medical History, Past Surgical History, Family History and Social History were reviewed in [employer] record.     Review of Systems: +vaginal dryness +vaginal burning Recent UTI No sex in 6 years    Physical Exam:BP 135/69 (BP Location: Left Arm, Patient Position: Sitting, Cuff Size: Normal)   Pulse (!) 59   Ht 5\' 5"  (1.651 m)   Wt 157 lb (71.2 kg)   BMI 26.13 kg/m  General:  Well developed, well nourished, no acute distress Skin:  Warm and dry Pelvic:  External genitalia is normal in appearance, no lesions.  The vagina is pale with loss of color, moisture and rugae. Urethra has no lesions or masses. The cervix is smooth.  Uterus is felt to be normal size, shape, and contour.  No adnexal masses or tenderness noted.Bladder is non tender, no masses felt. Psych:  No mood changes, alert and cooperative,seems happy Fall risk is low. PHQ 2 score 0. Examination chaperoned by Marchelle Folks Rash LPN.  Used phone interpretor.   Impression: 1. Vaginal dryness   2. Vaginal atrophy       Plan: Meds ordered this encounter  Medications  . conjugated estrogens (PREMARIN) vaginal cream    Sig: Use 0.5 gm in vagina at HS for 2 weeks then 2-3 x weekly    Dispense:  42.5 g    Refill:  2    Order Specific Question:   Supervising Provider    Answer:   Duane Lope H [[id number]]   Pap and physical in 3 months per her request

## 2018-11-01 DIAGNOSIS — Z136 Encounter for screening for cardiovascular disorders: Secondary | ICD-10-CM | POA: Diagnosis not present

## 2018-11-01 DIAGNOSIS — R1013 Epigastric pain: Secondary | ICD-10-CM | POA: Diagnosis not present

## 2018-11-01 DIAGNOSIS — L249 Irritant contact dermatitis, unspecified cause: Secondary | ICD-10-CM | POA: Diagnosis not present

## 2018-11-01 DIAGNOSIS — Z6827 Body mass index (BMI) 27.0-27.9, adult: Secondary | ICD-10-CM | POA: Diagnosis not present

## 2018-11-01 DIAGNOSIS — R079 Chest pain, unspecified: Secondary | ICD-10-CM | POA: Diagnosis not present

## 2018-11-01 DIAGNOSIS — Z6828 Body mass index (BMI) 28.0-28.9, adult: Secondary | ICD-10-CM | POA: Diagnosis not present

## 2018-11-01 DIAGNOSIS — K760 Fatty (change of) liver, not elsewhere classified: Secondary | ICD-10-CM | POA: Diagnosis not present

## 2018-11-01 DIAGNOSIS — M791 Myalgia, unspecified site: Secondary | ICD-10-CM | POA: Diagnosis not present

## 2018-11-04 DIAGNOSIS — K219 Gastro-esophageal reflux disease without esophagitis: Secondary | ICD-10-CM | POA: Diagnosis not present

## 2018-11-04 DIAGNOSIS — R682 Dry mouth, unspecified: Secondary | ICD-10-CM | POA: Diagnosis not present

## 2018-11-04 DIAGNOSIS — R1013 Epigastric pain: Secondary | ICD-10-CM | POA: Diagnosis not present

## 2018-11-04 DIAGNOSIS — R0602 Shortness of breath: Secondary | ICD-10-CM | POA: Diagnosis not present

## 2018-11-04 DIAGNOSIS — N390 Urinary tract infection, site not specified: Secondary | ICD-10-CM | POA: Diagnosis not present

## 2018-11-17 ENCOUNTER — Telehealth: Payer: Self-pay | Admitting: Women's Health

## 2018-11-17 NOTE — Telephone Encounter (Signed)
Pt is having irritation cant sleep

## 2018-11-18 ENCOUNTER — Other Ambulatory Visit: Payer: Self-pay

## 2018-11-18 ENCOUNTER — Encounter: Payer: Self-pay | Admitting: Adult Health

## 2018-11-18 ENCOUNTER — Ambulatory Visit: Payer: BLUE CROSS/BLUE SHIELD | Admitting: Adult Health

## 2018-11-18 ENCOUNTER — Telehealth: Payer: Self-pay | Admitting: *Deleted

## 2018-11-18 VITALS — BP 134/76 | HR 63 | Temp 98.2°F | Ht 65.0 in | Wt 154.0 lb

## 2018-11-18 DIAGNOSIS — R630 Anorexia: Secondary | ICD-10-CM | POA: Diagnosis not present

## 2018-11-18 DIAGNOSIS — R61 Generalized hyperhidrosis: Secondary | ICD-10-CM

## 2018-11-18 DIAGNOSIS — R319 Hematuria, unspecified: Secondary | ICD-10-CM

## 2018-11-18 DIAGNOSIS — R232 Flushing: Secondary | ICD-10-CM | POA: Diagnosis not present

## 2018-11-18 DIAGNOSIS — F419 Anxiety disorder, unspecified: Secondary | ICD-10-CM | POA: Diagnosis not present

## 2018-11-18 LAB — POCT URINALYSIS DIPSTICK
Glucose, UA: NEGATIVE
Ketones, UA: NEGATIVE
Leukocytes, UA: NEGATIVE
Nitrite, UA: NEGATIVE
Protein, UA: NEGATIVE

## 2018-11-18 MED ORDER — CONJ ESTROG-MEDROXYPROGEST ACE 0.45-1.5 MG PO TABS: 1.0000 | ORAL_TABLET | Freq: Every day | ORAL | 1 refills | Status: DC

## 2018-11-18 NOTE — Progress Notes (Signed)
Patient ID: Zoe Garcia, female   DOB: 1966-01-16, 53 y.o.   MRN: [id number] History of Present Illness: Zoe Garcia is a 53 year old Hispanic female, PM in complaining of night sweats, hot flashes and feels shaky in the am and has increased anxiety. She is working at [employer].    Current Medications, Allergies, Past Medical History, Past Surgical History, Family History and Social History were reviewed in [employer] record.     Review of Systems: +night sweats +hot flashes +anxiety +shaky+rest legs at times +eyes feel drowsy  Decrease in appetite  Burning with urination, was treated for UTI with Cipro by Roe Rutherford NP     Physical Exam:BP 134/76 (BP Location: Left Arm, Patient Position: Sitting, Cuff Size: Normal)   Pulse 63   Temp 98.2 F (36.8 C)   Ht 5\' 5"  (1.651 m)   Wt 154 lb (69.9 kg)   BMI 25.63 kg/m urine dipstick trace blood General:  Well developed, well nourished, no acute distress Skin:  Warm and dry Neck:  Midline trachea, normal thyroid, good ROM, no lymphadenopathy Lungs; Clear to auscultation bilaterally Cardiovascular: Regular rate and rhythm Psych:  No mood changes, alert and cooperative,seems happy Fall risk is low. Phone interpretor was used.  Will check labs and discussed try HRT, and she agress. Will send urine for UA C&S.   Impression and Plan: 1. Hot flashes Will rx prempro Meds ordered this encounter  Medications  . estrogen, conjugated,-medroxyprogesterone (PREMPRO) 0.45-1.5 MG tablet    Sig: Take 1 tablet by mouth daily.    Dispense:  30 tablet    Refill:  1    Order Specific Question:   Supervising Provider    Answer:   Lazaro Arms [[id number]]  -Review handouts in spanish on menopause and HRT -Follow up in 4 weeks with interpretor   2. Night sweats Will start prempro - Hemoglobin A1c  3. Anxiety   4. Decreased appetite -eat small frequent amounts  - CBC - Comprehensive metabolic panel - TSH -  Hemoglobin A1c  5. Hematuria, unspecified type -increase water  - POCT Urinalysis Dipstick - Urinalysis, Routine w reflex microscopic - Urine Culture

## 2018-11-18 NOTE — Patient Instructions (Signed)
Menopausia y terapia de reemplazo hormonal Menopause and Hormone Replacement Therapy Qu es la terapia de reemplazo hormonal?  La terapia de reemplazo hormonal (TRH) es el uso de hormonas artificiales (sintticas) para Microbiologist las hormonas que el cuerpo deja de producir durante la menopausia. La menopausia es la etapa normal de la vida cuando los perodos Becton[employer] and Company desaparecen y los ovarios dejan de producir hormonas femeninas, estrgenos y Education officer, museum. Esta falta de hormonas puede afectar la salud y causar sntomas indeseables. La TRH puede aliviar algunos de esos sntomas. Cules son mis opciones para Neomia Dear TRH? La TRH puede consistir en hormonas sintticas de Astronomer y Education officer, museum o puede consistir solamente en estrgeno (terapia de estrgenos solamente). Usted y su mdico decidirn qu tipo de TRH es la ms [place] para usted. Si elige Winferd Humphrey TRH y tiene tero, generalmente se prescribe estrgeno y progesterona. La terapia de estrgenos solamente se [place] para las mujeres que no tienen tero. Las siguientes son algunas de las opciones para Neomia Dear TRH:  Pastillas.  Parches.  Geles.  Aerosoles.  Cremas vaginales.  Anillo vaginal.  Dispositivos vaginales. La cantidad de hormonas que tome y Museum/gallery conservator en que deba tomarlas depender de su salud en particular. Es importante:  Comenzar una TRH con la dosis ms baja posible.  Interrumpa la TRH tan pronto como su mdico se lo indique.  Colabore con su mdico para estar bien informada y sentirse cmoda con sus decisiones. Cules son los beneficios de la TRH? La TRH puede reducir la frecuencia y la gravedad de los sntomas de la menopausia. Los beneficios de la TRH pueden variar segn los sntomas de la menopausia que tenga, la gravedad de estos y su salud general. La TRH puede ayudarle a mejorar los siguientes sntomas de la menopausia:  Sofocos y sudores nocturnos. Estos consisten en una sensacin de calor que se extiende por el  rostro y el cuerpo. La piel se puede volver roja, como ruborizada. Los sudores nocturnos son oleadas de calor que ocurren al estar dormida o tratando de dormir.  Prdida de masa sea (osteoporosis). El organismo pierde calcio ms rpido despus de la menopausia, lo que debilita a los [place]. Esto puede aumentar el riesgo de huesos rotos (fracturas).  Sequedad vaginal. La membrana de la vagina se hace ms delgada y seca lo que puede causar dolor durante las relaciones sexuales o causar infecciones, ardor o picazn.  Infecciones en las vas urinarias.  Incontinencia urinaria. Esta es la disminucin de la capacidad de Chief Operating Officer la [place].  Irritabilidad.  Problemas de memoria de [employer]. Cules son los riesgos de la TRH? Los riesgos de la TRH pueden variar segn su salud y sus antecedentes mdicos. Los riesgos de la TRH tambin dependen de si usted recibe estrgeno y progesterona o solamente estrgeno. La TRH puede aumentar el riesgo de lo siguiente:  Wilburt Finlay prdidas. Estas ocurren cuando la vagina pierde repentinamente una pequea cantidad de [place].  Cncer de endometrio. Este cncer ocurre en la membrana del tero (endometrio).  Cncer de mama.  Aumenta la densidad del tejido de las [place]. Esto puede dificultar la deteccin de cncer de mamas con una radiografa de mamas (mamografa).  Accidente cerebrovascular.  Infarto de miocardio.  Cogulos de [place].  Enfermedad de la vescula biliar. Los riesgos de la TRH pueden ser [employer] si tiene alguna de las siguientes afecciones:  Database administrator de endometrio.  Enfermedad heptica.  Enfermedad cardaca.  Cncer de mama.  Antecedentes de cogulos sanguneos.  Antecedentes de accidente cerebrovascular. Cmo debo cuidarme  cuando est realizando una TRH?  Tome los medicamentos de venta libre y los recetados solamente como se lo haya indicado el mdico.  Hgase mamografas, exmenes plvicos y chequeos con la frecuencia que le  indique el mdico.  Hgase pruebas de prueba de Papanicolaou con la frecuencia que le indique el mdico. A esta prueba tambin se la denomina "Pap". Es una prueba de deteccin que se [place] para detectar signos de cncer en el cuello uterino y la vagina. La prueba de prueba de Papanicolaou tambin puede identificar la presencia de infeccin o cambios precancerosos. Las pruebas de Papanicolaou se pueden realizar con la siguiente frecuencia: ? Cada 3 aos, a partir de los [address]. ? Cada 5 aos, a partir de los [address] [place], en combinacin con las pruebas que se realizan para Landscape architect presencia del virus del Geneticist, molecular (VPH). ? Con mayor o menor frecuencia segn las afecciones mdicas que tenga, su edad y otros factores de [place].  Es su responsabilidad retirar el resultado de la prueba de prueba de Papanicolaou. Consulte a su mdico o en el departamento donde se realice el estudio cundo estarn [employer]s.  Concurra a todas las visitas de control como se lo haya indicado el mdico. Esto es importante. Cundo debo buscar atencin mdica? Hable con el mdico en los siguientes casos:  Tiene alguno de estos sntomas: ? Dolor o hinchazn en las piernas. ? Falta de aire. ? Dolor en el pecho. ? Bultos o cambios en las mamas o en las [place]. ? Hablar arrastrando las palabras. ? Dolor, ardor o sangrado al ConocoPhillips.  Tiene alguno de estos sntomas: ? Hemorragia vaginal inusual. ? Mareos o dolores de [place]. ? Adormecimiento o debilidad en cualquier parte de los brazos o de las piernas. ? Dolor en el abdomen. Esta informacin no tiene Theme park manager el consejo del mdico. Asegrese de hacerle al mdico cualquier pregunta que tenga. Document Released: 11/25/2007 Document Revised: 10/21/2016 Document Reviewed: 12/10/2014 Elsevier Interactive Patient Education  2019 [employer]. La menopausia Menopause La menopausia es el perodo normal de la vida en el que los perodos  menstruales cesan por completo. Por lo general, se confirma tras de no haber tenido un perodo menstrual. La transicin a la menopausia (perimenopausia), con mayor frecuencia, ocurre entre los 45 y los 55aos. Durante la perimenopausia, los niveles hormonales cambian en el [place], lo que puede provocar sntomas y Ship broker. La menopausia podra aumentar el riesgo de sufrir lo siguiente:  Prdida de masa sea (osteoporosis), lo que predispone a la rotura de huesos (fracturas).  Depresin.  Endurecimiento y Scientist, research (medical) de las arterias (aterosclerosis), lo que puede causar infartos de miocardio y accidentes cerebrovasculares. Cules son las causas? A menudo, la causa de esta afeccin es un cambio natural en los niveles hormonales, que ocurre a medida que envejece. La afeccin tambin podra ser provocada por una ciruga en la que se extraigan ambos ovarios (ooforectoma bilateral). Qu incrementa el riesgo? Es ms probable que esta afeccin comience a una temprana edad si tiene ciertas afecciones o se realiza ciertos tratamientos, incluidos los siguientes:  Un tumor en la hipfisis del cerebro.  Una enfermedad que afecte los ovarios y la produccin de hormonas.  Radioterapia para tratar Management consultant.  Ciertos tratamientos contra el cncer, como quimioterapia o terapia hormonal (antiestrgeno).  Fumar mucho y consumir alcohol de forma excesiva.  Antecedentes familiares de menopausia temprana. Adems, es ms probable que esta afeccin se presente de manera temprana en las mujeres  que son muy delgadas. Cules son los signos o los sntomas? Los sntomas de esta afeccin incluyen los siguientes:  Engineer, maintenance.  Perodos menstruales irregulares.  Sudoracin nocturna.  Cambios en el sentimiento respecto de las [employer]. Es posible que el deseo sexual disminuya o que se sienta ms cmoda respecto de su sexualidad.  Sequedad vaginal y adelgazamiento de las  paredes de la vagina. Esto podra hacer que sienta dolor durante las relaciones sexuales.  Sequedad de la piel y aparicin de Banker.  Dolores de [place].  Problemas para dormir (insomnio).  Cambios de humor o irritabilidad.  Problemas de memoria.  Aumento de [place].  Crecimiento de bello en la cara y el pecho.  Infecciones en la vejiga o problemas para orinar. Cmo se diagnostica? Esta afeccin se diagnostica en funcin de los antecedentes mdicos, un examen fsico, la edad, los antecedentes menstruales y los sntomas. Tambin le podran realizar estudios hormonales. Cmo se trata? En algunos los casos, no se necesita tratamiento. Usted y el mdico deben decidir juntos si se debe Pensions consultant. El tratamiento se determinar en funcin de su cuadro clnico y de sus preferencias. El tratamiento de este cuadro clnico se centra en el control de los sntomas. El tratamiento puede incluir lo siguiente:  Terapia hormonal para la menopausia.  Medicamentos para tratar sntomas o complicaciones especficos.  Acupuntura.  Vitaminas o suplementos herbales. Antes de Microbiologist, es importante que le avise al mdico si tiene antecedentes personales o familiares de lo siguiente:  Enfermedades cardacas.  Cncer de mama.  Cogulos de [place].  Diabetes.  Osteoporosis. Siga estas indicaciones en su casa: Estilo de vida  No consuma ningn producto que contenga nicotina o tabaco, como cigarrillos y Administrator, Civil Service. Si necesita ayuda para dejar de fumar, consulte al mdico.  Realice, por lo menos, de actividad fsica 5das por semana o ms.  Evite las bebidas con alcohol o cafena, as como las W.W[employer] Inc. Esto podra ayudar a prevenir los acaloramientos.  Intente dormir de 7 a 8horas todas las noches.  Si tiene acaloramientos: ? Vstase en capas. ? Evite las cosas que podran Barnes & Noble acaloramientos, como las  comidas muy condimentadas, los lugares calientes o el estrs. ? Respire profundamente y despacio cuando comience a Scientist, water quality. ? Tenga un ventilador en su casa y en su oficina.  Encuentre modos de MGM MIRAGE, por ejemplo, a travs de la respiracin, meditacin o un diario ntimo.  Considere la posibilidad de asistir a terapia grupal con otras mujeres que tengan sntomas de [place]. [place] recomendaciones al The Procter & Gamble reuniones de terapia grupal. Comida y bebida  Siga una dieta saludable y equilibrada que incluya cereales integrales, protenas magras, productos lcteos descremados y [place] frutas y verduras.  El mdico podra recomendarle que agregue una mayor cantidad de soja a su dieta. Algunos de los alimentos que contienen soja son el tofu, el tempeh y la [place] de soja.  Consuma muchos alimentos que contengan calcio y vitaminaD para Nutritional therapist salud sea. Algunos productos que contienen mucho calcio son los Enterprise Products, el yogur, los frijoles, las [place], las sardinas, el brcoli y la col rizada. Medicamentos  Baxter International de venta libre y los recetados solamente como se lo haya indicado el mdico.  Hable con el mdico antes de Corporate investment banker a tomar cualquier suplemento herbal. Tome las vitaminas y los suplementos recetados como se lo haya indicado el mdico. Estos pueden incluir lo siguiente: ? Calcio. Las mujeres de 51aos o ms  deben consumir 1200mg  (miligramos) de Fiserv. ? VitaminaD. Las mujeres necesitan entre 600 y 800unidades internacionales de vitaminaD todos [place]. ? VitaminasB12 yB6. Procure consumir de vitaminaB12 y 1,5mg  de vitaminaB6 todos los [address]. Instrucciones generales  Lleve un registro de sus perodos menstruales; incluya lo siguiente: ? El momento en que ocurren. ? Qu tan abundantes son y cunto duran. ? Cunto tiempo transcurre entre cada perodo menstrual.  Lleve un registro de  los sntomas; anote cundo comienzan, con qu frecuencia ocurren y cunto duran.  Use lubricantes o humectantes vaginales para aliviar la sequedad vaginal y Personnel officer durante las relaciones sexuales.  Concurra a todas las visitas de control como se lo haya indicado el mdico. Esto es importante. Esto incluye la terapia grupal y la psicoterapia. Comunquese con un mdico si:  An tiene perodos menstruales despus de los 55aos.  Siente dolor durante las [employer].  No tuvo perodos menstruales durante los ltimos y presenta sangrado vaginal. Solicite ayuda de inmediato si:  Tiene los siguientes sntomas: ? Depresin grave. ? Sangrado vaginal excesivo. ? Dolor al ConocoPhillips. ? Latidos cardacos rpidos o irregulares (palpitaciones). ? Dolor de cabeza intenso. ? Dolor en el abdomen (abdominal) o dispepsia grave.  Se cay y cree que se ha fracturado un hueso.  Siente dolor en el pecho o en la pierna.  Desarrolla problemas de visin.  Se toca un bulto en la mama. Resumen  La menopausia es el perodo normal de la vida en el que los perodos menstruales cesan por completo. Por lo general, se confirma tras de no haber tenido un perodo menstrual.  La transicin a la menopausia (perimenopausia), con mayor frecuencia, ocurre entre los 45 y los 55aos.  Los sntomas pueden controlarse mediante medicamentos, cambios en el estilo de vida y terapias complementarias, como acupuntura.  Siga una dieta equilibrada que incluya muchos nutrientes para Biochemist, clinical salud sea y la salud cardaca, y para Chief Operating Officer los sntomas durante la [place]. Esta informacin no tiene Theme park manager el consejo del mdico. Asegrese de hacerle al mdico cualquier pregunta que tenga. Document Released: 07/20/2006 Document Revised: 03/04/2017 Document Reviewed: 03/04/2017 Elsevier Interactive Patient Education  2019 [employer].

## 2018-11-18 NOTE — Telephone Encounter (Signed)
Erroneous encounter

## 2018-11-19 LAB — URINALYSIS, ROUTINE W REFLEX MICROSCOPIC
Bilirubin, UA: NEGATIVE
Glucose, UA: NEGATIVE
Ketones, UA: NEGATIVE
Leukocytes,UA: NEGATIVE
Nitrite, UA: NEGATIVE
Protein,UA: NEGATIVE
RBC, UA: NEGATIVE
Specific Gravity, UA: <=1.005 — AB (ref 1.005–1.030)
Urobilinogen, Ur: 0.2 mg/dL (ref 0.2–1.0)
pH, UA: 7.5 (ref 5.0–7.5)

## 2018-11-19 LAB — COMPREHENSIVE METABOLIC PANEL
ALT: 23 IU/L (ref 0–32)
AST: 22 IU/L (ref 0–40)
Albumin/Globulin Ratio: 2.1 (ref 1.2–2.2)
Albumin: 4.7 g/dL (ref 3.8–4.9)
Alkaline Phosphatase: 90 IU/L (ref 39–117)
BUN/Creatinine Ratio: 8 — ABNORMAL LOW (ref 9–23)
BUN: 5 mg/dL — ABNORMAL LOW (ref 6–24)
Bilirubin Total: 0.6 mg/dL (ref 0.0–1.2)
CO2: 25 mmol/L (ref 20–29)
Calcium: 10.2 mg/dL (ref 8.7–10.2)
Chloride: 99 mmol/L (ref 96–106)
Creatinine, Ser: 0.66 mg/dL (ref 0.57–1.00)
GFR calc Af Amer: 117 mL/min/{1.73_m2} (ref >59)
GFR calc non Af Amer: 101 mL/min/{1.73_m2} (ref >59)
Globulin, Total: 2.2 g/dL (ref 1.5–4.5)
Glucose: 118 mg/dL — ABNORMAL HIGH (ref 65–99)
Potassium: 4.1 mmol/L (ref 3.5–5.2)
Sodium: 140 mmol/L (ref 134–144)
Total Protein: 6.9 g/dL (ref 6.0–8.5)

## 2018-11-19 LAB — CBC
Hematocrit: 41.2 % (ref 34.0–46.6)
Hemoglobin: 14.1 g/dL (ref 11.1–15.9)
MCH: 30.8 pg (ref 26.6–33.0)
MCHC: 34.2 g/dL (ref 31.5–35.7)
MCV: 90 fL (ref 79–97)
Platelets: 326 10*3/uL (ref 150–450)
RBC: 4.58 x10E6/uL (ref 3.77–5.28)
RDW: 13.3 % (ref 11.7–15.4)
WBC: 7 10*3/uL (ref 3.4–10.8)

## 2018-11-19 LAB — HEMOGLOBIN A1C
Est. average glucose Bld gHb Est-mCnc: 114 mg/dL
Hgb A1c MFr Bld: 5.6 % (ref 4.8–5.6)

## 2018-11-19 LAB — TSH: TSH: 0.936 u[IU]/mL (ref 0.450–4.500)

## 2018-11-20 LAB — URINE CULTURE

## 2018-12-05 ENCOUNTER — Other Ambulatory Visit: Payer: BLUE CROSS/BLUE SHIELD | Admitting: Obstetrics and Gynecology

## 2018-12-16 ENCOUNTER — Ambulatory Visit (INDEPENDENT_AMBULATORY_CARE_PROVIDER_SITE_OTHER): Payer: BC Managed Care – PPO | Admitting: Adult Health

## 2018-12-16 ENCOUNTER — Other Ambulatory Visit (HOSPITAL_COMMUNITY)
Admission: RE | Admit: 2018-12-16 | Discharge: 2018-12-16 | Disposition: A | Discharge status: Home / Self Care | Payer: BC Managed Care – PPO | Source: Ambulatory Visit | Attending: Adult Health | Admitting: Adult Health

## 2018-12-16 ENCOUNTER — Encounter: Payer: Self-pay | Admitting: Adult Health

## 2018-12-16 ENCOUNTER — Other Ambulatory Visit: Payer: Self-pay

## 2018-12-16 VITALS — BP 131/76 | HR 63 | Ht 62.25 in | Wt 145.5 lb

## 2018-12-16 DIAGNOSIS — Z1212 Encounter for screening for malignant neoplasm of rectum: Secondary | ICD-10-CM | POA: Diagnosis not present

## 2018-12-16 DIAGNOSIS — N952 Postmenopausal atrophic vaginitis: Secondary | ICD-10-CM

## 2018-12-16 DIAGNOSIS — Z01419 Encounter for gynecological examination (general) (routine) without abnormal findings: Secondary | ICD-10-CM | POA: Insufficient documentation

## 2018-12-16 DIAGNOSIS — R319 Hematuria, unspecified: Secondary | ICD-10-CM

## 2018-12-16 DIAGNOSIS — R1013 Epigastric pain: Secondary | ICD-10-CM

## 2018-12-16 DIAGNOSIS — Z1211 Encounter for screening for malignant neoplasm of colon: Secondary | ICD-10-CM | POA: Diagnosis not present

## 2018-12-16 DIAGNOSIS — K5909 Other constipation: Secondary | ICD-10-CM

## 2018-12-16 LAB — POCT URINALYSIS DIPSTICK
Glucose, UA: NEGATIVE
Ketones, UA: NEGATIVE
Leukocytes, UA: NEGATIVE
Nitrite, UA: NEGATIVE
Protein, UA: NEGATIVE

## 2018-12-16 LAB — HEMOCCULT GUIAC POC 1CARD (OFFICE): Fecal Occult Blood, POC: NEGATIVE

## 2018-12-16 NOTE — Progress Notes (Signed)
Patient ID: Zoe Garcia, female   DOB: [birth year], 53 y.o.   MRN: [id number] History of Present Illness: Zoe Garcia is a 53 year old Hispanic female, separated, in with interrupter for well woman gyn exam and pap. She is complaining of decreased appetite, pain in stomach, constipation at times, burning in vagina still and hair loss. She stopped the Prempro, she said it made her stomach hurt worse, and the hot flashes and night sweats have stopped, but she feels jittery in side at times, and was seen at [facility] in [place] and had labs last week and has follow up appt with them 7/2 a 6 pm, and was prescribed klonopin for her anxiety. She is still using PVC 2 x a week. She has appt with Dr Oneida Alar 7/2 a 2:30 pm but can't go then will call and change the time. And she stopped the Prilosec and started Nexium OTC.   Current Medications, Allergies, Past Medical History, Past Surgical History, Family History and Social History were reviewed in [employer] record.     Review of Systems: Patient denies any headaches, hearing loss, fatigue, blurred vision, shortness of breath, chest pain,  problems with  urination, or intercourse(last sex 6 years ago). No joint pain or mood swings. See HPI for positves.    Physical Exam:BP 131/76 (BP Location: Left Arm, Patient Position: Sitting, Cuff Size: Normal)   Pulse 63   Ht 5' 2.25" (1.581 m)   Wt 145 lb 8 oz (66 kg)   BMI 26.40 kg/m   Urine dipstick trace blood but urinalysis on 5/29 was negative at lab. General:  Well developed, well nourished, no acute distress Skin:  Warm and dry Neck:  Midline trachea, normal thyroid, good ROM, no lymphadenopathy Lungs; Clear to auscultation bilaterally Breast:  No dominant palpable mass, retraction, or nipple discharge Cardiovascular: Regular rate and rhythm Abdomen:  Soft, no hepatosplenomegaly, +tenderness in epigastric area  Pelvic:  External genitalia is normal in appearance, no  lesions.  The vagina is pinker and has better moisture since using PVC. Urethra has no lesions or masses. The cervix is smooth,pap with HPV performed.  Uterus is felt to be normal size, shape, and contour.  No adnexal masses or tenderness noted.Bladder is non tender, no masses felt. Rectal: Good sphincter tone, no polyps, or hemorrhoids felt.  Hemoccult negative. Extremities/musculoskeletal:  No swelling or varicosities noted, no clubbing or cyanosis Psych:  No mood changes, alert and cooperative,seems anxious Fall risk is low. PHQ 9 score is 18, denies being suicidal but worries over things, try taking 1/4 to 1/2 tablet of klonopin, she is afford to take "drugs" Examination chaperoned by Levy Pupa LPN  Impression: 1. Encounter for gynecological examination with Papanicolaou smear of cervix   2. Hematuria, unspecified type   3. Encounter for colorectal cancer screening   4. Epigastric pain   5. CONSTIPATION, CHRONIC   6. Vaginal atrophy       Plan: Continue PVC 3 x weekly Physical in 1 year Pap in 3 if normal Mammogram yearly See Dr Oneida Alar [facility]

## 2018-12-20 LAB — CYTOLOGY - PAP
Diagnosis: NEGATIVE
HPV: NOT DETECTED

## 2018-12-22 ENCOUNTER — Ambulatory Visit: Payer: BLUE CROSS/BLUE SHIELD | Admitting: Gastroenterology

## 2019-03-20 ENCOUNTER — Telehealth: Payer: Self-pay | Admitting: Gastroenterology

## 2019-03-20 NOTE — Telephone Encounter (Signed)
[phone] PATIENT FRIEND CALLED AND SAID PATIENT WANTED TO KNOW WHEN SHE COULD HAVE A TCS-WOLD SHE NEED AN OV OR A NURSE VISIT

## 2019-03-20 NOTE — Telephone Encounter (Signed)
Ov due to meds. Not sure when last tcs was as it was done in [place].

## 2019-03-21 ENCOUNTER — Encounter: Payer: Self-pay | Admitting: Gastroenterology

## 2019-03-21 NOTE — Telephone Encounter (Signed)
PATIENT SCHEDULED AND LETTER SENT  °

## 2019-03-27 ENCOUNTER — Ambulatory Visit: Payer: Self-pay | Admitting: Family Medicine

## 2019-03-28 ENCOUNTER — Encounter: Payer: Self-pay | Admitting: Family Medicine

## 2019-04-10 ENCOUNTER — Encounter: Payer: Self-pay | Admitting: Gastroenterology

## 2019-04-25 ENCOUNTER — Ambulatory Visit: Payer: BC Managed Care – PPO | Admitting: Gastroenterology

## 2019-04-26 ENCOUNTER — Ambulatory Visit: Payer: BC Managed Care – PPO | Admitting: Gastroenterology

## 2019-04-26 ENCOUNTER — Other Ambulatory Visit: Payer: Self-pay | Admitting: *Deleted

## 2019-04-26 ENCOUNTER — Encounter: Payer: Self-pay | Admitting: Gastroenterology

## 2019-04-26 ENCOUNTER — Encounter: Payer: Self-pay | Admitting: *Deleted

## 2019-04-26 ENCOUNTER — Other Ambulatory Visit: Payer: Self-pay

## 2019-04-26 VITALS — BP 129/71 | HR 68 | Temp 97.1°F | Ht 62.0 in | Wt 130.4 lb

## 2019-04-26 DIAGNOSIS — Z1211 Encounter for screening for malignant neoplasm of colon: Secondary | ICD-10-CM

## 2019-04-26 DIAGNOSIS — Z1212 Encounter for screening for malignant neoplasm of rectum: Secondary | ICD-10-CM | POA: Diagnosis not present

## 2019-04-26 DIAGNOSIS — R634 Abnormal weight loss: Secondary | ICD-10-CM

## 2019-04-26 MED ORDER — NA SULFATE-K SULFATE-MG SULF 17.5-3.13-1.6 GM/177ML PO SOLN: 1.0000 | Freq: Once | ORAL | 0 refills | Status: AC

## 2019-04-26 NOTE — Addendum Note (Signed)
Addended by: Cheron Every on: 04/26/2019 04:21 PM   Modules accepted: Orders

## 2019-04-26 NOTE — Patient Instructions (Addendum)
1. Colonoscopy as scheduled. See separate instructions.  2. Please count your daily calorie intake over the next one week to make sure you are consuming adequate amount of calories. Goal of 2000 calories per day at minimum.     Colonoscopia segn lo programado. Consulte las instrucciones por separado.  Cuente su ingesta diaria de caloras durante la prxima semana para asegurarse de que est consumiendo la cantidad [place] de caloras. Objetivo de 2000 caloras por Owens [employer].     Dieta rica en protenas y caloras High-Protein and High-Calorie Diet Consumir alimentos ricos en protenas y caloras puede ayudarlo a aumentar de [place], curarse despus de una lesin y recuperarse despus de una enfermedad o [place]. La cantidad especfica de protenas y caloras diarias que necesita depende de lo siguiente:  Su peso corporal.  El motivo por el que se le recomienda esta dieta. En qu consiste el plan? En general, una dieta rica en protenas y caloras incluye lo siguiente:  Comer entre 250 y 500caloras adicionales por Training and development officer.  Asegurarse de que una cantidad suficientes de sus caloras diarias provengan de protenas. Pregunte a su mdico cuntas de las caloras que consume deberan provenir de protenas. Hable con un mdico, como un especialista en alimentacin y nutricin (nutricionista), sobre la cantidad de protenas y caloras que necesita consumir Armed forces operational officer. Siga la dieta como se lo haya indicado el mdico. Cules son algunos consejos para seguir este plan?  Preparacin de las comidas  Agregue leche entera, mitad Miguel Barrera y mitad crema o crema espesa a los cereales, el pudin, la sopa o el chocolate caliente.  Agregue Mattel a las bebidas instantneas de desayuno.  Agregue mantequilla de man a la avena o los batidos.  Agregue R.R. Donnelley a los productos de [place], los batidos o los batidos con [place].  Agregue R.R. Donnelley, crema o mantequilla al pur de  papas.  Agregue queso a las verduras cocidas.  Prepare postres helados con yogur de Mattel. Cbralos con granola, frutas o frutos secos.  Sunset a las frutas.  Agregue aguacate, queso o ambos a los sndwiches o las [place].  Agregue carne, aves o mariscos a las pastas, el arroz, los guisos, las ensaladas y las sopas.  Utilice mayonesa cuando prepare ensalada de arroz, pollo o atn.  Use mantequilla de man como aderezo para verduras o para acompaar pretzels, apio o galletas.  Agregue frijoles a los guisos, las salsas y las pastas para untar.  Agregue frijoles hechos pur a las salsas y las sopas.  Reemplace las bebidas bajas en caloras por bebidas ricas en caloras, como leche y [place] de fruta.  Reemplace agua con leche o crema espesa cuando prepare alimentos como avena, postres o Youth worker. Instrucciones generales  Pregntele al mdico si debe tomar un suplemento nutricional.  Trate de consumir seis comidas pequeas por TEFL teacher de tres comidas abundantes.  Consuma una dieta equilibrada. En cada comida, incluya un alimento con alto contenido de protenas.  Tenga bocadillos nutritivos a su disposicin, como frutos secos, mezcla de frutos secos, frutas desecadas y Estate agent.  Si tiene una enfermedad renal o diabetes, hable con el mdico sobre la cantidad de protenas que es seguro consumir en su caso. Ingerir Curator un esfuerzo adicional de los riones.  Beba sus caloras. Elija bebidas ricas en caloras y bbalas despus de las comidas. Qu alimentos con alto contenido de protenas debera comer?  Darci Current. Guisantes. Cereales [place]. Trigo burgol.  Carnes y [address] protenas Carne de [place], cerdo y aves. Pescado y Liberty Global. Huevos. Tofu. Protenas de verduras texturadas (PVT). Mantequilla de man. Frutos secos y semillas. Frijoles secos. Protenas en polvo. Lcteos Leche entera. Yogur de Eastman Kodak. Leche  en polvo. Queso. CSX Corporation. [place]. Bebidas Bebidas con suplementos proteicos. Leche de soja. Otros alimentos Barras de protenas. Es posible que los productos que se enumeran ms arriba no sean una lista completa de los alimentos y bebidas con alto contenido de protenas. Comunquese con un nutricionista para conocer ms opciones. Qu alimentos con alto contenido calrico debera comer? Nils Pyle Frutas desecadas. Frutas secas. Frutas enlatadas con almbar. Jugo de frutas. [place]. Verduras Verduras cocidas en aceite o mantequilla. Papas fritas. Cereales Pastas. Panes sin levadura. Muffins. Panqueques. Cereales listos para consumir. Carnes y otras protenas [place] de man. Frutos secos y semillas. Lcteos Crema espesa. Crema batida. Queso crema. Tami Ribas. Helados. Natillas. Pudin. Bebidas Bebidas de reemplazo de comidas. Batidos nutritivos. Jugo de frutas. Refrescos endulzados con azcar. Alios y condimentos Condimento para ensalada. Mayonesa. Carmelina Paddock. Mermeladas o jaleas de frutas. Miel. Doreen Beam. Dulces y Pharmacist, hospital. Galletitas. Tarta. Pasteles. Barras de caramelo. Chocolate. Grasas y Hospital doctor o [place]. Aceite. Salsas. Otros alimentos Barras de reemplazo de comidas. Es posible que los productos que se enumeran ms arriba no sean una lista completa de los alimentos y bebidas con alto contenido calrico. Comunquese con un nutricionista para conocer ms opciones. Resumen  Una dieta rica en protenas y caloras puede ayudarlo a aumentar de peso o a curarse ms rpido despus de una lesin, enfermedad o [place].  Para aumentar su consumo de protenas y caloras, agregue ingredientes como [place], [place] de man, queso, frijoles, carne o frutos de mar a las comidas.  Para obtener una cantidad suficiente de caloras adicionales cada da, incluya alimentos y bebidas ricos en caloras en cada comida.  Agregar Neomia Dear bebida hipercalrica o batido  puede ser Neomia Dear forma fcil para ayudarlo a obtener la cantidad suficiente de Corporate treasurer. Hable con su mdico o nutricionista sobre la mejor opcin para usted. Esta informacin no tiene Theme park manager el consejo del mdico. Asegrese de hacerle al mdico cualquier pregunta que tenga. Document Released: 03/29/2013 Document Revised: 09/18/2017 Document Reviewed: 06/07/2017 Elsevier Patient Education  2020 [employer].

## 2019-04-26 NOTE — Progress Notes (Signed)
Primary Care Physician:  Hardie Shackleton  Primary Gastroenterologist:  Barney Drain, MD   Chief Complaint  Patient presents with  . Colonoscopy    last tcs in 2009-normal per pt    HPI:  Zoe Garcia is a 53 y.o. female, Spanish speaking, presenting with formal interpreter for purpose of scheduling a schedule colonoscopy.  Reportedly had a normal colonoscopy in [place] 2009.  EGD in May 2009 Dr. Oneida Alar with unremarkable findings. Heme-negative stool back in June.  Patient reports mild intermittent constipation which she controls with diet. In the past tried Miralax but it caused bloating. States Dr. Vallarie Mare gave her iron to help her "gain weight" but it has increased her bloating and abdominal discomfort so she stopped it. She notes 25-30 pound weight loss in the past six months which she relates to her anxiety. She states she is so jittery and did her on research. She has eliminated all simple sugars. Cut out ALL of her sugary drinks. Now drinks only water. She is hoping her weight loss will stabilize.  No heartburn on Nexium. No dysphagia. No vomiting. Vague luq abd pain on occasion.   She is now taking clonazepam for anxiety and temazepam to help her sleep.      Current Outpatient Medications  Medication Sig Dispense Refill  . clonazePAM (KLONOPIN) 1 MG tablet Takes half a tab once a day prn    . conjugated estrogens (PREMARIN) vaginal cream Use 0.5 gm in vagina at HS for 2 weeks then 2-3 x weekly (Patient taking differently: Use 0.5 gm in vagina at HS for 2 weeks then 2-3 x weekly prn) 42.5 g 2  . esomeprazole (NEXIUM) 20 MG capsule Take 20 mg by mouth daily at 12 noon.    . Fe Fum-FA-B Cmp-C-Zn-Mg-Mn-Cu (HEMOCYTE PLUS PO) Take 1 capsule by mouth daily. Last took 04/23/19 d/t ?tiredness    . Na Sulfate-K Sulfate-Mg Sulf 17.5-3.13-1.6 GM/177ML SOLN Take 1 kit by mouth once for 1 dose. 354 mL 0  . temazepam (RESTORIL) 30 MG capsule Take 30 mg by mouth at bedtime.     No current  facility-administered medications for this visit.     Allergies as of 04/26/2019 - Review Complete 04/26/2019  Allergen Reaction Noted  . Ampicillin Hives 12/04/2015    Past Medical History:  Diagnosis Date  . Anxiety   . Chronic constipation   . Early menopause   . Globus sensation 2009   NL BPE 2008, 2010  . Mental disorder     Past Surgical History:  Procedure Laterality Date  . ESOPHAGOGASTRODUODENOSCOPY  05/09   NL ESO BX     Family History  Problem Relation Age of Onset  . Hypertension Mother   . Diabetes Father 82    Social History   Socioeconomic History  . Marital status: Legally Separated    Spouse name: Not on file  . Number of children: Not on file  . Years of education: Not on file  . Highest education level: Not on file  Occupational History  . Occupation: Works at [employer] in the [place]  . Financial resource strain: Not on file  . Food insecurity    Worry: Not on file    Inability: Not on file  . Transportation needs    Medical: Not on file    Non-medical: Not on file  Tobacco Use  . Smoking status: Never Smoker  . Smokeless tobacco: Never Used  Substance and Sexual Activity  .  Alcohol use: No  . Drug use: No  . Sexual activity: Not Currently    Birth control/protection: Post-menopausal    Comment: has been 6 years  Lifestyle  . Physical activity    Days per week: Not on file    Minutes per session: Not on file  . Stress: Not on file  Relationships  . Social Herbalist on phone: Not on file    Gets together: Not on file    Attends religious service: Not on file    Active member of club or organization: Not on file    Attends meetings of clubs or organizations: Not on file    Relationship status: Not on file  . Intimate partner violence    Fear of current or ex partner: Not on file    Emotionally abused: Not on file    Physically abused: Not on file    Forced sexual activity: Not on file   Other Topics Concern  . Not on file  Social History Narrative  . Not on file      ROS:  General: Negative for anorexia, fever, chills, fatigue, weakness.see hpi Eyes: Negative for vision changes.  ENT: Negative for hoarseness, difficulty swallowing , nasal congestion. CV: Negative for chest pain, angina, palpitations, dyspnea on exertion, peripheral edema.  Respiratory: Negative for dyspnea at rest, dyspnea on exertion, cough, sputum, wheezing.  GI: See history of present illness. GU:  Negative for dysuria, hematuria, urinary incontinence, urinary frequency, nocturnal urination.  MS: Negative for joint pain, low back pain.  Derm: Negative for rash or itching.  Neuro: Negative for weakness, abnormal sensation, seizure, frequent headaches, memory loss, confusion.  Psych: Negative for anxiety, depression, suicidal ideation, hallucinations.  Endo: see hpi Heme: Negative for bruising or bleeding. Allergy: Negative for rash or hives.    Physical Examination:  BP 129/71   Pulse 68   Temp (!) 97.1 F (36.2 C) (Temporal)   Ht _0  (1.575 m)   Wt 130 lb 6.4 oz (59.1 kg)   BMI 23.85 kg/m    General: Well-nourished, well-developed in no acute distress.  Head: Normocephalic, atraumatic.   Eyes: Conjunctiva pink, no icterus. Mouth: Oropharyngeal mucosa moist and pink , no lesions erythema or exudate. Neck: Supple without thyromegaly, masses, or lymphadenopathy.  Lungs: Clear to auscultation bilaterally.  Heart: Regular rate and rhythm, no murmurs rubs or gallops.  Abdomen: Bowel sounds are normal, nontender, nondistended, no hepatosplenomegaly or masses, no abdominal bruits or    hernia , no rebound or guarding.   Rectal: not performed Extremities: No lower extremity edema. No clubbing or deformities.  Neuro: Alert and oriented x 4 , grossly normal neurologically.  Skin: Warm and dry, no rash or jaundice.   Psych: Alert and cooperative, normal mood and affect.  Labs: Lab  Results  Component Value Date   CREATININE 0.66 11/18/2018   BUN 5 (L) 11/18/2018   NA 140 11/18/2018   K 4.1 11/18/2018   CL 99 11/18/2018   CO2 25 11/18/2018   Lab Results  Component Value Date   ALT 23 11/18/2018   AST 22 11/18/2018   ALKPHOS 90 11/18/2018   BILITOT 0.6 11/18/2018   Lab Results  Component Value Date   WBC 7.0 11/18/2018   HGB 14.1 11/18/2018   HCT 41.2 11/18/2018   MCV 90 11/18/2018   PLT 326 11/18/2018   Lab Results  Component Value Date   TSH 0.936 11/18/2018     Imaging Studies:  No results found.

## 2019-04-26 NOTE — Assessment & Plan Note (Addendum)
Unintentional 30 pound weight loss in past six months. Patient has made significant dietary modifications which likely explain her weight loss. She has eliminated all simple carbohydrates. Labs unremarkable. Recommend she perform calorie count over one week to ensure she is getting adequate calories per day to maintain weight. Call with ongoing weight loss.   Colonoscopy as planned.

## 2019-04-26 NOTE — Assessment & Plan Note (Signed)
Due for 10 year screening colonoscopy. Given polypharmacy, anxiety, will plan for deep sedation.  I have discussed the risks, alternatives, benefits with regards to but not limited to the risk of reaction to medication, bleeding, infection, perforation and the patient is agreeable to proceed. Written consent to be obtained.

## 2019-06-03 IMAGING — MG DIGITAL SCREENING BILATERAL MAMMOGRAM WITH TOMO AND CAD
4 series · 4 of 8 positions shown · non-contrast
Comparison: Previous exam(s).

CLINICAL DATA: Screening.

EXAM:
DIGITAL SCREENING BILATERAL MAMMOGRAM WITH TOMO AND CAD

[R CC synth-2D]
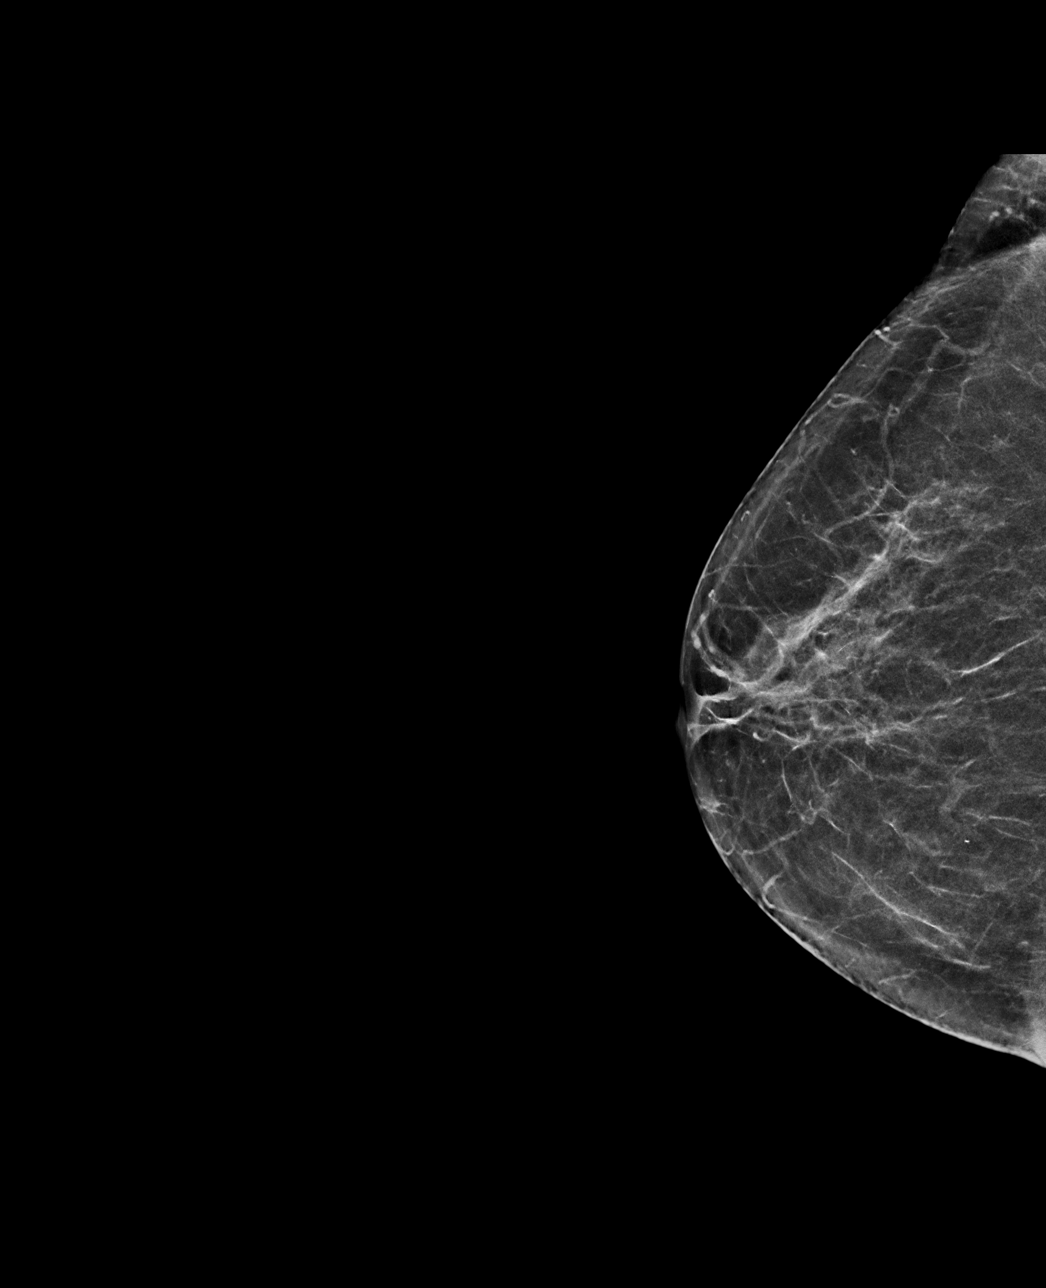

[L MLO synth-2D]
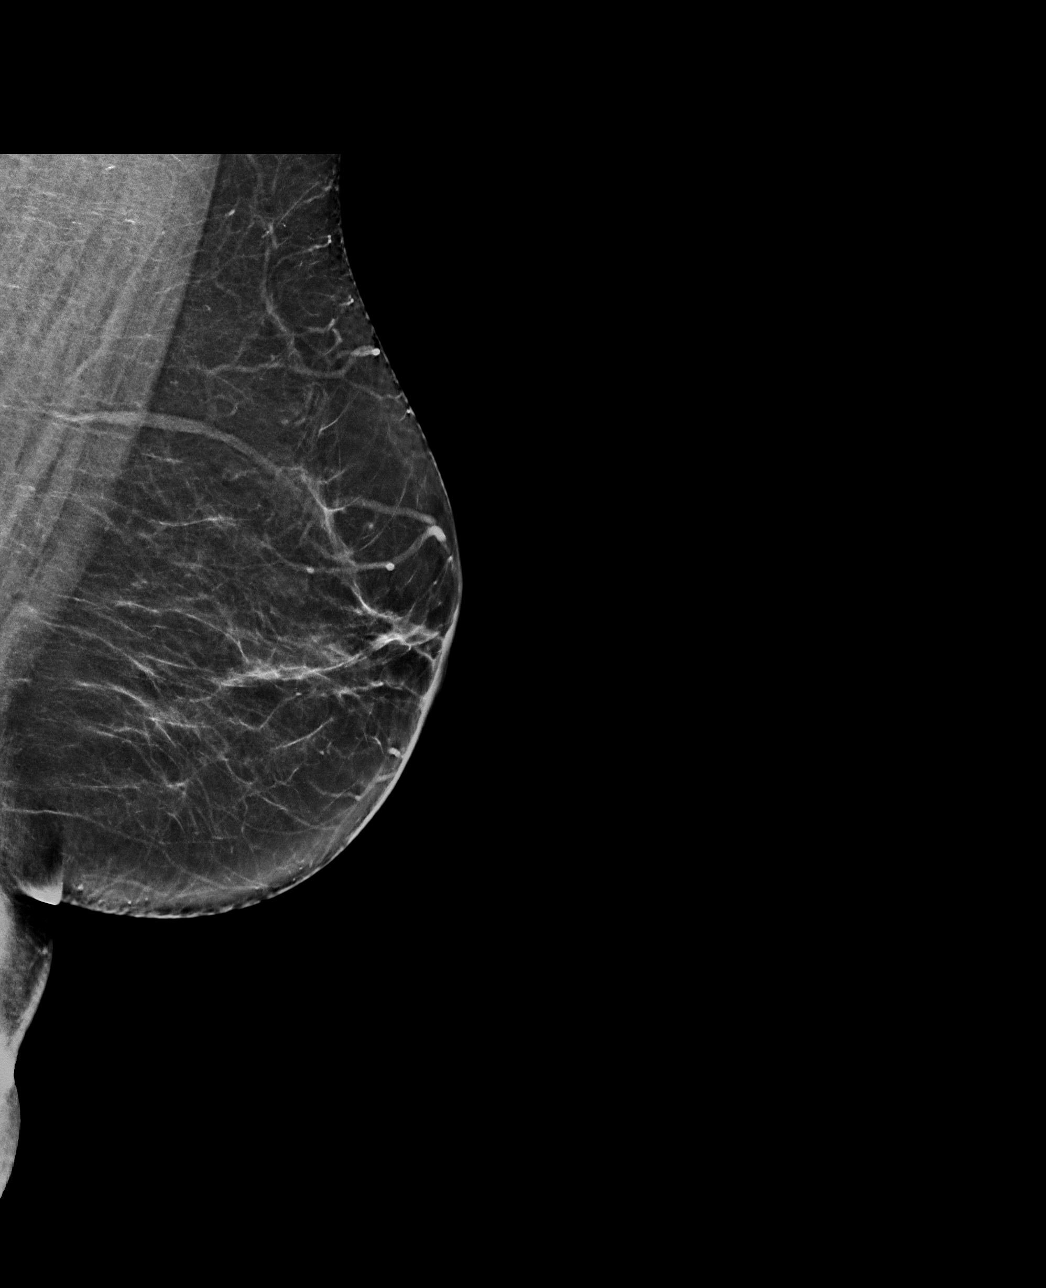

[R MLO synth-2D]
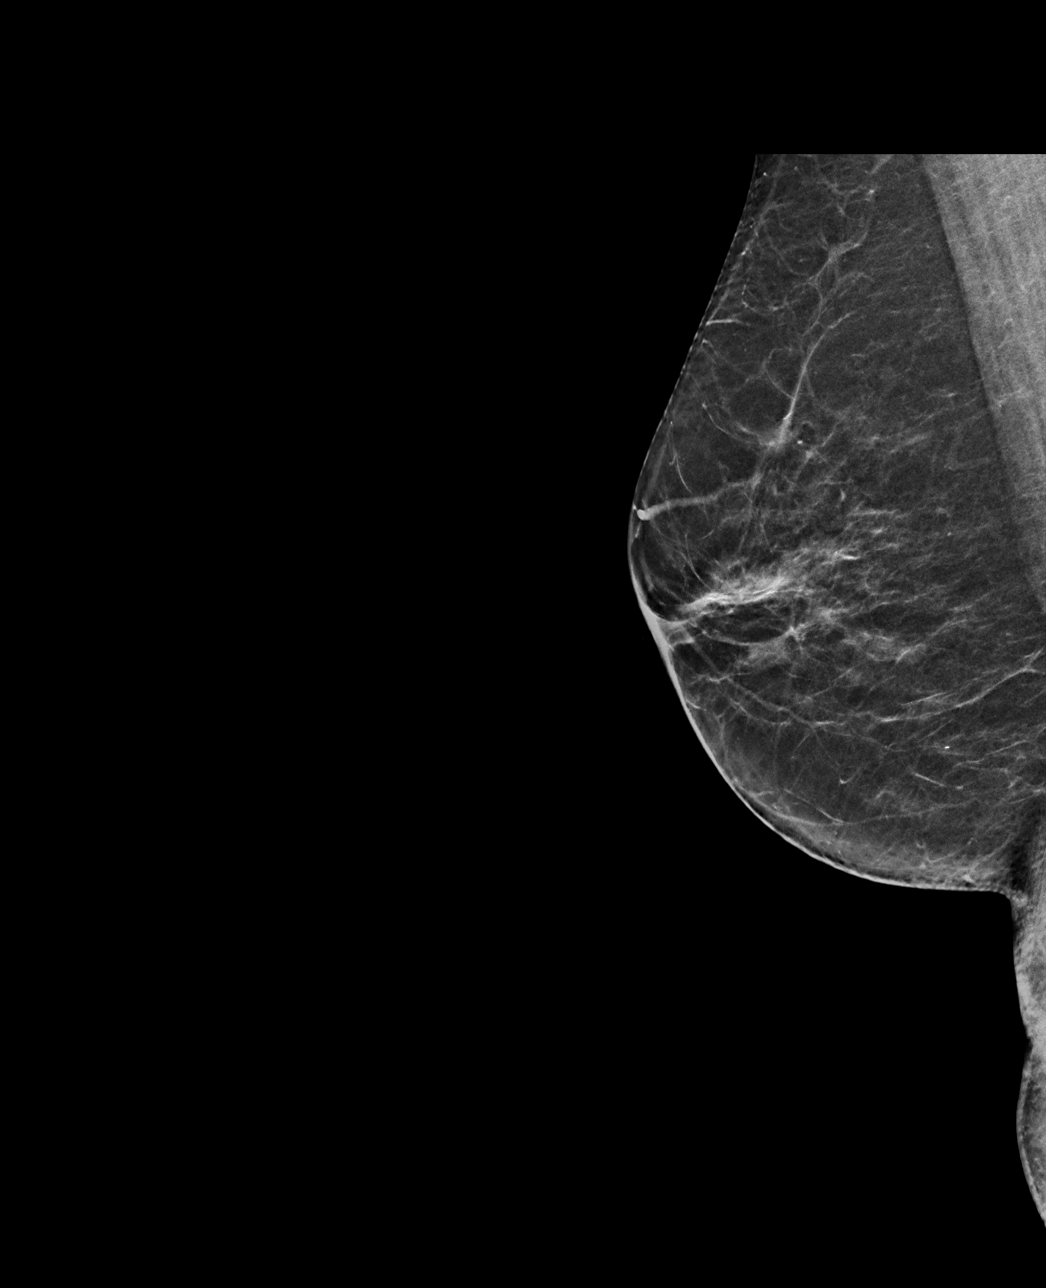

[R CC tomo · tomo slice 31/60.0]
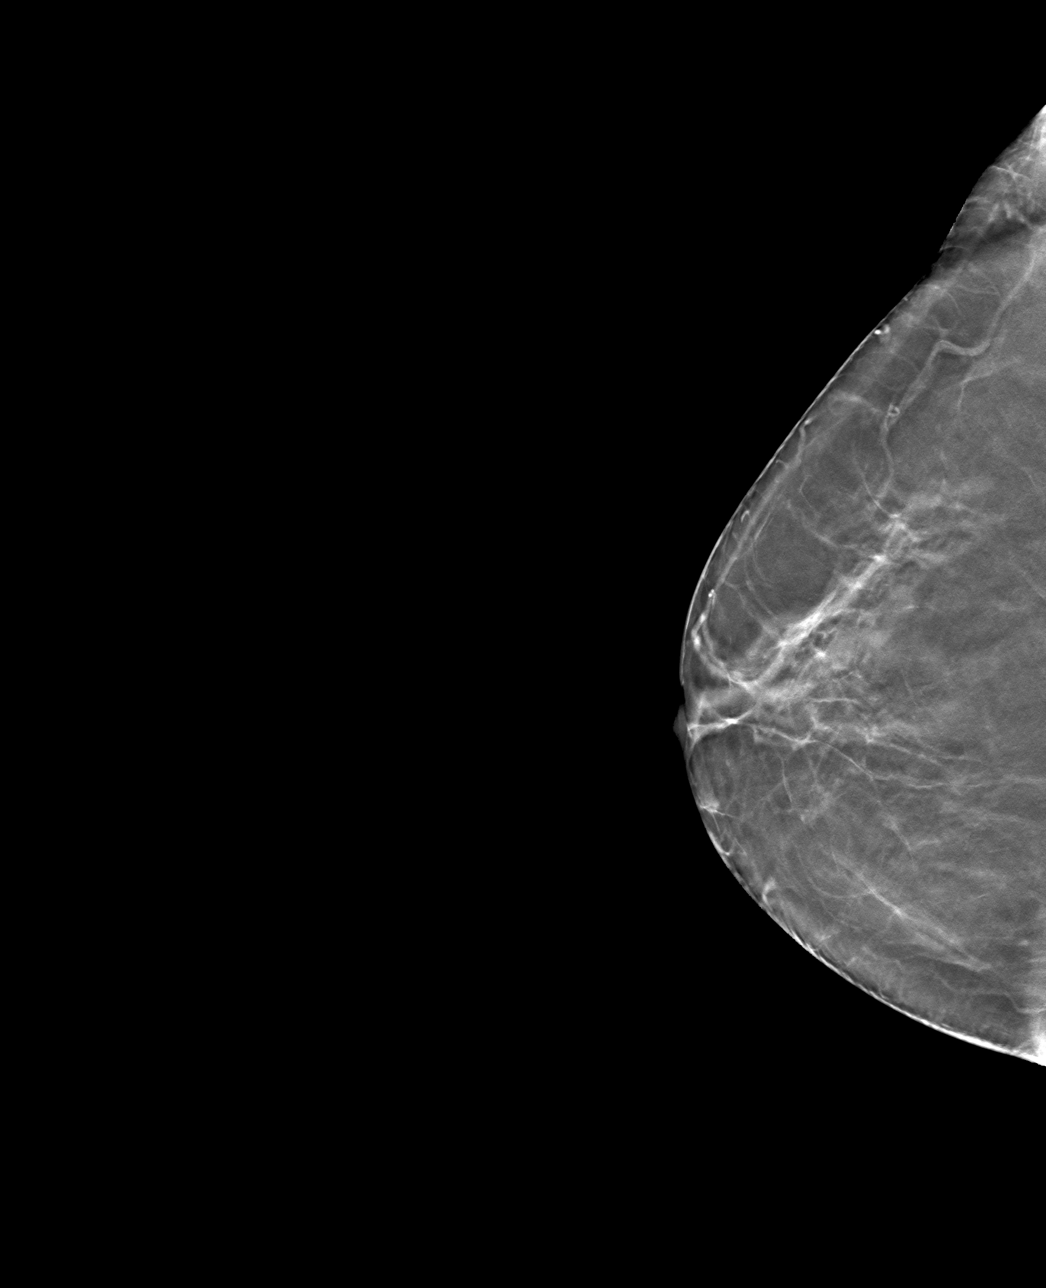

[4 of 8 positions shown; findings below may reference images not displayed]

ACR Breast Density Category b: There are scattered areas of
fibroglandular density.
FINDINGS: There are no findings suspicious for malignancy. Images were
processed with CAD.
IMPRESSION: No mammographic evidence of malignancy. A result letter of this
screening mammogram will be mailed directly to the patient.

RECOMMENDATION:
Screening mammogram in one year. (Code:[id number])

BI-RADS CATEGORY  1: Negative.

## 2019-07-28 NOTE — Patient Instructions (Signed)
Instrucciones Para Antes de la Ciruga   Su ciruga est programada para-(your procedure is scheduled on)  08/08/2019 st 0745 AM   Zoe Garcia     Por favor llame al [phone] si tiene algn problema en la maana de la ciruga. (please call if you have any problems the morning of surgery.)                  Recuerde:    Follow the prep instructions from Dr Darrick Penna office.   Tome estas medicinas en la maana de la ciruga con un SORBITO de agua (take these meds the morning of surgery with a SIP of water) Clonazepam, nexium.   Puede cepillarse los dientes en la maana de la [place]. (you may brush your teeth the morning of surgery)   No use joyas, maquillaje de ojos, lpiz labial, crema para el cuerpo o esmalte de uas oscuro. (Do not wear jewelry, eye makeup, lipstick, body lotion, or dark fingernail polish)   No puede usar desodorante. (you may wear deodorant)   Si va a ser ingresado despues de la ciruga, deje la maleta en el carro hasta que se le haya asignado una habitacin. (If you are to be admitted after surgery, leave suitcase in car until your room has been assigned.)   A los pacientes que se les d de alta el mismo da no se les permitir manejar a casa.  (Patients discharged on the day of surgery will not be allowed to drive home)   Use ropa suelta y cmoda de regreso a casa. (wear loose comfortable clothes for ride home)    Colonoscopia en los adultos, cuidados posteriores Colonoscopy, Adult, Care After Esta hoja le brinda informacin sobre cmo cuidarse despus del procedimiento. El mdico tambin podr darle instrucciones ms especficas. Si tiene problemas o preguntas, llame a su mdico. Qu puedo esperar despus del procedimiento? Despus del procedimiento, es normal tener los siguientes sntomas:  Una pequea cantidad de sangre en la materia fecal (heces) durante 24 horas.  Gases.  Clicos leves o  distensin en el vientre (abdomen). Siga estas instrucciones en su casa: Comida y bebida   Beba suficiente lquido para mantener el pis (la [place]) de color amarillo plido.  Siga las instrucciones del mdico respecto de lo que no puede comer o beber.  Reanude la dieta normal como se lo haya indicado el mdico. Evite los alimentos pesados o fritos que son difciles de Location manager. Actividad  Haga reposo como se lo haya indicado el mdico.  No permanezca sentado durante un perodo prolongado sin moverse. Levntese y camine un poco cada 1 a 2 horas. Esto es importante. Pida ayuda si se siente dbil o inestable.  Retome sus actividades normales segn lo indicado por el mdico. Pregntele al mdico qu actividades son seguras para usted. Para aliviar los clicos y la hinchazn:   Intente caminar por la casa.  Aplquese calor en el vientre como se lo haya indicado el mdico. Use la fuente de calor que el mdico le recomiende, como una compresa de calor hmedo o una almohadilla trmica. ? Ponga una toalla entre la piel y la fuente de Airline pilot. ? Aplique calor durante 20 a . ? Retire la fuente  de calor si la piel se pone de color rojo brillante. Esto es muy importante si no puede Education officer, environmental, calor o fro. Puede correr un riesgo mayor de sufrir quemaduras. Instrucciones generales  Durante las primeras 24horas despus del procedimiento: ? No conduzca ni opere maquinaria. ? No firme documentos importantes. ? No beba alcohol. ? Haga sus actividades diarias ms lentamente que lo normal. ? Coma alimentos que sean blandos y fciles de Publishing copy.  Tome los medicamentos de venta libre o los recetados solamente como se lo haya indicado el mdico.  Consulting civil engineer a todas las visitas de seguimiento como se lo haya indicado el mdico. Esto es importante. Comunquese con un mdico si:  Tiene sangre en la materia fecal 2 o 3 das despus del procedimiento. Solicite ayuda de inmediato si:  Hay ms  que una pequea cantidad de [employer] materia fecal.  Observa grandes grumos de tejido (cogulos de sangre) en la materia fecal.  Tiene el abdomen hinchado.  Tiene ganas de vomitar (nuseas).  Vomita.  Tiene fiebre.  Tiene dolor en el vientre que empeora y no se alivia con los medicamentos. Resumen  Despus del procedimiento, es normal tener una pequea cantidad de [employer]. Tambin puede tener clicos o hinchazn leves en el abdomen.  Durante las primeras 24 horas despus del procedimiento, no conduzca ni opere maquinaria, no firme documentos importantes y no beba alcohol.  Obtenga ayuda de inmediato si tiene [employer], tiene ganas de Training and development officer, tiene fiebre o Conservation officer, historic buildings abdominal [place]. Esta informacin no tiene Marine scientist el consejo del mdico. Asegrese de hacerle al mdico cualquier pregunta que tenga. Document Revised: 02/22/2019 Document Reviewed: 02/22/2019 Elsevier Patient Education  [place] (Monitored Anesthesia Care) Anestesia es un trmino que se refiere a Scientist, research (physical sciences), procedimientos y [employer] que ayudan a una persona a Buyer, retail segura y cmoda durante un procedimiento mdico. La anestesia monitorizada, o sedacin, es un tipo de anestesia. El anestesista puede recomendar sedacin si usted se someter a un procedimiento para el que no necesita estar inconsciente, por ejemplo:  Ciruga de cataratas.  Procedimiento dental.  Biopsia.  Colonoscopia. Durante el procedimiento, puede recibir un medicamento que lo ayudar a Nurse, children's (sedante). Existen tres niveles de sedacin:  Sedacin leve. En este nivel, puede estar despierto y sentirse relajado. Podr seguir indicaciones.  Sedacin moderada. En este nivel, estar adormecido. Es posible que no recuerde el procedimiento.  Sedacin profunda. En este nivel, estar dormido. No recordar el procedimiento. Cuanta ms cantidad de USAA  administren, ms profundo ser su nivel de sedacin. Dependiendo de cmo responda al procedimiento, el anestesista puede cambiar su nivel de sedacin o el tipo de anestesia para atender sus necesidades. Un anestesista lo monitorizar con Freight forwarder. INFORME A SU MDICO:  Cualquier alergia que tenga.  Todos los [employer], incluidos vitaminas, hierbas, gotas oftlmicas, cremas y medicamentos de venta libre.  Uso de corticoides (por va oral o en crema).  Cualquier problema que usted o sus familiares hayan tenido con sedantes y anestsicos.  Enfermedades de la sangre que tenga.  Cirugas previas.  Cualquier afeccin que padezca, como apnea del sueo.  Si est embarazada o podra estarlo.  Consumo de cigarrillos, alcohol o drogas. RIESGOS Y COMPLICACIONES En general, se trata de un procedimiento seguro. Sin embargo, pueden presentarse problemas, por ejemplo:  Recibir demasiado medicamento (sedacin excesiva).  Nuseas.  Reaccin alrgica a un medicamento.  Problemas para respirar. Si esto ocurre,  es posible que se utilice un tubo respiratorio para ayudarlo a Industrial/product designer. El tubo se retirar cuando despierte y respire por su cuenta.  Problemas cardacos.  Problemas pulmonares. ANTES DEL PROCEDIMIENTO Mantenerse hidratado Siga las indicaciones del mdico acerca de la hidratacin, las cuales pueden incluir lo siguiente:  Hasta 2horas antes del procedimiento, puede beber lquidos transparentes, como agua, jugos frutales transparentes, caf negro y t solo. Restricciones en las comidas y [address] Siga las indicaciones del mdico respecto de las comidas y las bebidas, las cuales pueden incluir lo siguiente:  Ocho horas antes del procedimiento, deje de ingerir comidas o alimentos pesados, por ejemplo, carne, alimentos fritos o alimentos grasos.  Seis horas antes del procedimiento, deje de ingerir comidas o alimentos livianos, como tostadas o  cereales.  Seis horas antes del procedimiento, deje de beber [place] o bebidas que [employer].  Dos horas antes del procedimiento, deje de beber lquidos transparentes. Medicamentos Consulte al mdico si debe hacer o no lo siguiente:  Multimedia programmer o suspender los medicamentos que toma habitualmente. Esto es muy importante si toma medicamentos para la diabetes o anticoagulantes.  Tomar medicamentos como aspirina e ibuprofeno. Estos medicamentos pueden tener un efecto anticoagulante en la [place]. No tome estos medicamentos antes del procedimiento si el mdico le indica que no lo haga. Pruebas y exmenes  Le harn un examen fsico.  Posiblemente deba realizarse anlisis de sangre para evaluar lo siguiente: ? Cmo estn funcionando los riones y English as a second language teacher. ? Con qu eficacia coagula la sangre. Instrucciones generales  Haga planes para que una persona lo lleve a su casa desde el hospital o la clnica.  Si se ir a su casa inmediatamente despus del procedimiento, planifique que alguien se quede con usted durante 24horas. PROCEDIMIENTO  Le controlarn la presin arterial, la frecuencia cardaca, la respiracin, el nivel de dolor y el estado general.  Conley Rolls colocarn una va intravenosa (IV) en una de las venas.  El Administrator, arts medicamentos, segn sea necesario, para que se sienta cmodo durante el procedimiento. Esto puede implicar un cambio en el nivel de sedacin.  Se realizar el procedimiento mdico. DESPUS DEL PROCEDIMIENTO  Le controlarn la presin arterial, la frecuencia cardaca, la frecuencia respiratoria y Air cabin crew de oxgeno en la sangre hasta que haya desaparecido el efecto de los medicamentos administrados.  No conduzca durante 24horas si le administraron un sedante.  Es posible que le suceda lo siguiente: ? Sentirse adormecido, torpe o nauseoso. ? Olvidarse de lo que sucedi despus del procedimiento. ? Tener dolor de garganta si le colocaron un tubo  respiratorio durante el procedimiento. ? Vomitar. Esta informacin no tiene Theme park manager el consejo del mdico. Asegrese de hacerle al mdico cualquier pregunta que tenga. Document Revised: 02/13/2016 Document Reviewed: 09/29/2015 Elsevier Patient Education  2020 [employer].

## 2019-08-04 ENCOUNTER — Other Ambulatory Visit: Payer: Self-pay

## 2019-08-04 ENCOUNTER — Other Ambulatory Visit (HOSPITAL_COMMUNITY)
Admission: RE | Admit: 2019-08-04 | Discharge: 2019-08-04 | Disposition: A | Discharge status: Home / Self Care | Payer: BC Managed Care – PPO | Source: Ambulatory Visit | Attending: Gastroenterology | Admitting: Gastroenterology

## 2019-08-04 ENCOUNTER — Encounter (HOSPITAL_COMMUNITY)
Admission: RE | Admit: 2019-08-04 | Discharge: 2019-08-04 | Disposition: A | Discharge status: Home / Self Care | Payer: BC Managed Care – PPO | Source: Ambulatory Visit | Attending: Gastroenterology | Admitting: Gastroenterology

## 2019-08-07 ENCOUNTER — Telehealth: Payer: Self-pay

## 2019-08-07 NOTE — Telephone Encounter (Signed)
Zoe Garcia at [facility] endo called office and LMOVM, pt was no show for COVID test 08/04/19. TCS is scheduled for 08/08/19.   Tried to call pt, no answer, no answering machine.

## 2019-08-07 NOTE — Telephone Encounter (Signed)
Procedure for tomorrow has been cancelled d/t no COVID test done. Tried to call pt, no answer.

## 2019-08-08 ENCOUNTER — Encounter (HOSPITAL_COMMUNITY): Admission: RE | Payer: Self-pay | Source: Home / Self Care

## 2019-08-08 ENCOUNTER — Ambulatory Visit (HOSPITAL_COMMUNITY)
Admission: RE | Admit: 2019-08-08 | Payer: BC Managed Care – PPO | Source: Home / Self Care | Admitting: Gastroenterology

## 2019-08-08 SURGERY — COLONOSCOPY WITH PROPOFOL
Anesthesia: Monitor Anesthesia Care

## 2019-12-14 DIAGNOSIS — H6122 Impacted cerumen, left ear: Secondary | ICD-10-CM | POA: Diagnosis not present

## 2019-12-14 DIAGNOSIS — H9203 Otalgia, bilateral: Secondary | ICD-10-CM | POA: Diagnosis not present

## 2019-12-14 DIAGNOSIS — H6123 Impacted cerumen, bilateral: Secondary | ICD-10-CM | POA: Diagnosis not present

## 2019-12-14 DIAGNOSIS — H6121 Impacted cerumen, right ear: Secondary | ICD-10-CM | POA: Diagnosis not present

## 2020-09-16 DIAGNOSIS — M79671 Pain in right foot: Secondary | ICD-10-CM | POA: Diagnosis not present

## 2020-09-16 DIAGNOSIS — M79672 Pain in left foot: Secondary | ICD-10-CM | POA: Diagnosis not present

## 2020-09-16 DIAGNOSIS — M7730 Calcaneal spur, unspecified foot: Secondary | ICD-10-CM | POA: Diagnosis not present

## 2020-09-16 DIAGNOSIS — M722 Plantar fascial fibromatosis: Secondary | ICD-10-CM | POA: Diagnosis not present

## 2021-07-11 DIAGNOSIS — Z1231 Encounter for screening mammogram for malignant neoplasm of breast: Secondary | ICD-10-CM | POA: Diagnosis not present

## 2022-09-07 DIAGNOSIS — M109 Gout, unspecified: Secondary | ICD-10-CM | POA: Diagnosis not present

## 2022-09-07 DIAGNOSIS — K219 Gastro-esophageal reflux disease without esophagitis: Secondary | ICD-10-CM | POA: Diagnosis not present

## 2022-09-07 DIAGNOSIS — E039 Hypothyroidism, unspecified: Secondary | ICD-10-CM | POA: Diagnosis not present

## 2022-09-07 DIAGNOSIS — M2550 Pain in unspecified joint: Secondary | ICD-10-CM | POA: Diagnosis not present

## 2022-10-01 DIAGNOSIS — K219 Gastro-esophageal reflux disease without esophagitis: Secondary | ICD-10-CM | POA: Diagnosis not present

## 2022-10-01 DIAGNOSIS — M810 Age-related osteoporosis without current pathological fracture: Secondary | ICD-10-CM | POA: Diagnosis not present

## 2022-10-01 DIAGNOSIS — M2550 Pain in unspecified joint: Secondary | ICD-10-CM | POA: Diagnosis not present

## 2022-10-29 DIAGNOSIS — E559 Vitamin D deficiency, unspecified: Secondary | ICD-10-CM | POA: Diagnosis not present

## 2022-10-29 DIAGNOSIS — K219 Gastro-esophageal reflux disease without esophagitis: Secondary | ICD-10-CM | POA: Diagnosis not present

## 2022-10-29 DIAGNOSIS — E039 Hypothyroidism, unspecified: Secondary | ICD-10-CM | POA: Diagnosis not present

## 2023-05-06 DIAGNOSIS — E7800 Pure hypercholesterolemia, unspecified: Secondary | ICD-10-CM | POA: Diagnosis not present

## 2023-05-06 DIAGNOSIS — M2550 Pain in unspecified joint: Secondary | ICD-10-CM | POA: Diagnosis not present

## 2023-05-06 DIAGNOSIS — M810 Age-related osteoporosis without current pathological fracture: Secondary | ICD-10-CM | POA: Diagnosis not present

## 2023-05-06 DIAGNOSIS — E039 Hypothyroidism, unspecified: Secondary | ICD-10-CM | POA: Diagnosis not present

## 2023-05-26 ENCOUNTER — Ambulatory Visit (INDEPENDENT_AMBULATORY_CARE_PROVIDER_SITE_OTHER): Payer: BC Managed Care – PPO | Admitting: Obstetrics & Gynecology

## 2023-05-26 ENCOUNTER — Encounter: Payer: Self-pay | Admitting: Obstetrics & Gynecology

## 2023-05-26 VITALS — BP 133/75 | HR 69 | Ht 62.0 in | Wt 203.0 lb

## 2023-05-26 DIAGNOSIS — L988 Other specified disorders of the skin and subcutaneous tissue: Secondary | ICD-10-CM | POA: Diagnosis not present

## 2023-05-26 DIAGNOSIS — Z1231 Encounter for screening mammogram for malignant neoplasm of breast: Secondary | ICD-10-CM

## 2023-05-26 MED ORDER — CLOTRIMAZOLE-BETAMETHASONE 1-0.05 % EX CREA: 1.0000 | TOPICAL_CREAM | Freq: Two times a day (BID) | CUTANEOUS | 0 refills | Status: AC

## 2023-05-26 NOTE — Progress Notes (Signed)
   GYN VISIT Patient name: Zoe Garcia MRN [id number]  Date of birth: September 24, 1965 Chief Complaint:   Breast Problem  History of Present Illness:   Zoe Garcia is a 57 y.o. [id number] female being seen today for the following concerns:  -Breast irritation: She reports that for the past week she has noted breast irritation and discomfort.  Denies nipple discharge. She has been using hydrocortisone cream with minimal improvement.  Denies trauma or other acute changes to her breast.  She reports that both breast have a "spot."  She reports no other acute complaints or concerns.  Per pt mammogram in [place] was negative last year   No LMP recorded. Patient is postmenopausal.    Review of Systems:   Pertinent items are noted in HPI Denies fever/chills, dizziness, headaches, visual disturbances, fatigue, shortness of breath, chest pain, abdominal pain, vomiting. Pertinent History Reviewed:   Past Surgical History:  Procedure Laterality Date   ESOPHAGOGASTRODUODENOSCOPY  05/09   NL ESO BX     Past Medical History:  Diagnosis Date   Anxiety    Chronic constipation    Early menopause    Globus sensation 2009   NL BPE 2008, 2010   Mental disorder    Reviewed problem list, medications and allergies. Physical Assessment:   Vitals:   05/26/23 1140  BP: 133/75  Pulse: 69  Weight: 203 lb (92.1 kg)  Height: 5\' 2"  (1.575 m)  Body mass index is 37.13 kg/m.       Physical Examination:   General appearance: alert, well appearing, and in no distress  Psych: mood appropriate, normal affect  Skin: warm & dry   Breast: on bilateral areola- a 2-3cm slightly raised hyperpigmented lesion noted, minimal scaling.  No nipple discharge.  No masses palpated bilaterally with Cardiovascular: normal heart rate noted  Respiratory: normal respiratory effort, no distress  Extremities: no edema   Chaperone: N/A    Spanish interpreter used by phone  Assessment & Plan:  1) Skin lesion of  breast -etiology unclear- DDx: yeast, ?psoriasis, dermatitis -unlikely to be Paget's disease since bilateral presentation -will also plan for mammogram -trial of Lotrisone, if no improvement consider referral to dermatology  []  f/u in 4 wks and also due for annual   Orders Placed This Encounter  Procedures   MM 3D SCREENING MAMMOGRAM BILATERAL BREAST   Meds ordered this encounter  Medications   clotrimazole-betamethasone (LOTRISONE) cream    Sig: Apply 1 Application topically 2 (two) times daily for 7 days.    Dispense:  45 g    Refill:  0     Return in about 4 weeks (around 06/23/2023) for 4 week annual/follow-up needs interpreter.   Myna Hidalgo, DO Attending Obstetrician & Gynecologist, [facility] for [employer], [facility]

## 2023-06-18 DIAGNOSIS — M25561 Pain in right knee: Secondary | ICD-10-CM | POA: Diagnosis not present

## 2023-06-18 DIAGNOSIS — M222X1 Patellofemoral disorders, right knee: Secondary | ICD-10-CM | POA: Diagnosis not present

## 2023-06-18 DIAGNOSIS — M1711 Unilateral primary osteoarthritis, right knee: Secondary | ICD-10-CM | POA: Diagnosis not present

## 2023-07-07 ENCOUNTER — Ambulatory Visit: Payer: BC Managed Care – PPO | Admitting: Obstetrics & Gynecology

## 2023-10-07 ENCOUNTER — Inpatient Hospital Stay
Admission: RE | Admit: 2023-10-07 | Discharge: 2023-10-07 | Disposition: A | Discharge status: Home / Self Care | Payer: Self-pay | Source: Ambulatory Visit | Attending: Obstetrics & Gynecology | Admitting: Obstetrics & Gynecology

## 2023-10-07 ENCOUNTER — Other Ambulatory Visit: Payer: Self-pay | Admitting: Obstetrics & Gynecology

## 2023-10-07 DIAGNOSIS — Z1231 Encounter for screening mammogram for malignant neoplasm of breast: Secondary | ICD-10-CM

## 2023-10-13 ENCOUNTER — Ambulatory Visit (HOSPITAL_COMMUNITY)
Admission: RE | Admit: 2023-10-13 | Discharge: 2023-10-13 | Disposition: A | Discharge status: Home / Self Care | Source: Ambulatory Visit | Attending: Obstetrics & Gynecology | Admitting: Obstetrics & Gynecology

## 2023-10-13 DIAGNOSIS — Z1231 Encounter for screening mammogram for malignant neoplasm of breast: Secondary | ICD-10-CM

## 2023-10-27 ENCOUNTER — Other Ambulatory Visit (HOSPITAL_COMMUNITY)
Admission: RE | Admit: 2023-10-27 | Discharge: 2023-10-27 | Disposition: A | Discharge status: Home / Self Care | Source: Ambulatory Visit | Attending: Adult Health | Admitting: Adult Health

## 2023-10-27 ENCOUNTER — Encounter: Payer: Self-pay | Admitting: Adult Health

## 2023-10-27 ENCOUNTER — Ambulatory Visit: Admitting: Adult Health

## 2023-10-27 VITALS — BP 158/86 | HR 66 | Ht 62.25 in | Wt 205.0 lb

## 2023-10-27 DIAGNOSIS — Z01419 Encounter for gynecological examination (general) (routine) without abnormal findings: Secondary | ICD-10-CM | POA: Insufficient documentation

## 2023-10-27 DIAGNOSIS — R030 Elevated blood-pressure reading, without diagnosis of hypertension: Secondary | ICD-10-CM | POA: Diagnosis not present

## 2023-10-27 DIAGNOSIS — Z1331 Encounter for screening for depression: Secondary | ICD-10-CM

## 2023-10-27 DIAGNOSIS — Z1211 Encounter for screening for malignant neoplasm of colon: Secondary | ICD-10-CM

## 2023-10-27 LAB — HEMOCCULT GUIAC POC 1CARD (OFFICE): Fecal Occult Blood, POC: NEGATIVE

## 2023-10-27 NOTE — Progress Notes (Signed)
 Patient ID: Zoe Garcia, female   DOB: Nov 06, 1965, 58 y.o.   MRN: [id number] History of Present Illness: Zoe Garcia is a 58 year old Hispanic female, separated, PM in for a well woman gyn exam and pap. She has interpreter with her, Lita Rieger.  PCP is [facility]  Current Medications, Allergies, Past Medical History, Past Surgical History, Family History and Social History were reviewed in [employer] record.     Review of Systems: Patient denies any headaches, hearing loss, fatigue, blurred vision, shortness of breath, chest pain, abdominal pain, problems with bowel movements, urination, or intercourse(no sex in 11 years). No joint pain or mood swings.  Denies any vaginal bleeding    Physical Exam:BP (!) 158/86 (BP Location: Left Arm, Patient Position: Sitting, Cuff Size: Large)   Pulse 66   Ht 5' 2.25" (1.581 m)   Wt 205 lb (93 kg)   BMI 37.19 kg/m   General:  Well developed, well nourished, no acute distress Skin:  Warm and dry Neck:  Midline trachea, normal thyroid , good ROM, no lymphadenopathy Lungs; Clear to auscultation bilaterally Breast:  No dominant palpable mass, retraction, or nipple discharge Cardiovascular: Regular rate and rhythm Abdomen:  Soft, non tender, no hepatosplenomegaly Pelvic:  External genitalia is normal in appearance, no lesions.  The vagina is pale. Urethra has no lesions or masses. The cervix is smooth, pap with HR HPV genotyping performed.  Uterus is felt to be normal size, shape, and contour.  No adnexal masses or tenderness noted.Bladder is non tender, no masses felt. Rectal: Good sphincter tone, no polyps, or hemorrhoids felt.  Hemoccult negative. Extremities/musculoskeletal:  No swelling or varicosities noted, no clubbing or cyanosis Psych:  No mood changes, alert and cooperative,seems happy AA is 0 Fall risk is low    10/27/2023    9:37 AM 12/16/2018   10:47 AM 12/16/2018   10:46 AM  Depression screen PHQ 2/9  Decreased Interest 0 3 3   Down, Depressed, Hopeless 0 3 3  PHQ - 2 Score 0 6 6  Altered sleeping 0 0   Tired, decreased energy 0 3   Change in appetite 0 3   Feeling bad or failure about yourself  0 3   Trouble concentrating 0 0   Moving slowly or fidgety/restless 0 3   Suicidal thoughts 0 0   PHQ-9 Score 0 18   Difficult doing work/chores  Not difficult at all        10/27/2023    9:38 AM  GAD 7 : Generalized Anxiety Score  Nervous, Anxious, on Edge 0  Control/stop worrying 0  Worry too much - different things 0  Trouble relaxing 0  Restless 0  Easily annoyed or irritable 0  Afraid - awful might happen 0  Total GAD 7 Score 0      Upstream - 10/27/23 0936       Pregnancy Intention Screening   Does the patient want to become pregnant in the next year? No    Does the patient's partner want to become pregnant in the next year? No    Would the patient like to discuss contraceptive options today? No      Contraception Wrap Up   Current Method No Contraceptive Precautions   PM   End Method No Contraception Precautions   PM   Contraception Counseling Provided No    How was the end contraceptive method provided? N/A            Examination chaperoned by  Tish RN   Impression and plan: 1. Encounter for gynecological examination with Papanicolaou smear of cervix (Primary) Pap sent Pap in 3 years if negative Physical with PCP Labs with PCP Had colonoscopy at [place] about 2 years ago  Mammogram was negative 10/13/23 - Cytology - PAP( [facility])  2. Encounter for screening fecal occult blood testing Hemoccult was negative   3. Elevated BP without diagnosis of hypertension Has appt with [facility] May 14 check BP then, she was nervous today does not like pelvic exams and did not slepp well last night

## 2023-11-01 ENCOUNTER — Telehealth: Payer: Self-pay | Admitting: *Deleted

## 2023-11-01 LAB — CYTOLOGY - PAP
Comment: NEGATIVE
Diagnosis: NEGATIVE
High risk HPV: NEGATIVE

## 2023-11-01 NOTE — Telephone Encounter (Signed)
 Interpreter left normal pap results on answering machine @ 2:12 pm. JSY

## 2023-11-01 NOTE — Telephone Encounter (Signed)
-----   Message from Lendia Quay sent at 11/01/2023  1:23 PM EDT ----- Let her know about pap. THX
